# Patient Record
Sex: Male | Born: 1950 | Race: White | Hispanic: No | Marital: Married | Smoking: Current every day smoker
Health system: Southern US, Community
[De-identification: ages and names within clinical notes are randomized; demographics above are authoritative.]

## PROBLEM LIST (undated history)

## (undated) DIAGNOSIS — G4733 Obstructive sleep apnea (adult) (pediatric): Secondary | ICD-10-CM

## (undated) DIAGNOSIS — C61 Malignant neoplasm of prostate: Secondary | ICD-10-CM

## (undated) DIAGNOSIS — E785 Hyperlipidemia, unspecified: Secondary | ICD-10-CM

## (undated) DIAGNOSIS — K219 Gastro-esophageal reflux disease without esophagitis: Secondary | ICD-10-CM

## (undated) DIAGNOSIS — Z87438 Personal history of other diseases of male genital organs: Secondary | ICD-10-CM

## (undated) DIAGNOSIS — I82409 Acute embolism and thrombosis of unspecified deep veins of unspecified lower extremity: Secondary | ICD-10-CM

## (undated) DIAGNOSIS — E291 Testicular hypofunction: Secondary | ICD-10-CM

## (undated) DIAGNOSIS — K2270 Barrett's esophagus without dysplasia: Secondary | ICD-10-CM

## (undated) DIAGNOSIS — J45909 Unspecified asthma, uncomplicated: Secondary | ICD-10-CM

## (undated) DIAGNOSIS — I011 Acute rheumatic endocarditis: Secondary | ICD-10-CM

## (undated) DIAGNOSIS — L0390 Cellulitis, unspecified: Secondary | ICD-10-CM

## (undated) DIAGNOSIS — M069 Rheumatoid arthritis, unspecified: Secondary | ICD-10-CM

## (undated) DIAGNOSIS — I82499 Acute embolism and thrombosis of other specified deep vein of unspecified lower extremity: Secondary | ICD-10-CM

## (undated) DIAGNOSIS — N529 Male erectile dysfunction, unspecified: Secondary | ICD-10-CM

## (undated) HISTORY — DX: Acute embolism and thrombosis of other specified deep vein of unspecified lower extremity: I82.499

## (undated) HISTORY — DX: Male erectile dysfunction, unspecified: N52.9

## (undated) HISTORY — DX: Acute rheumatic endocarditis: I01.1

## (undated) HISTORY — PX: PROSTATE BIOPSY: SHX241

## (undated) HISTORY — DX: Cellulitis, unspecified: L03.90

## (undated) HISTORY — DX: Unspecified asthma, uncomplicated: J45.909

## (undated) HISTORY — DX: Acute embolism and thrombosis of unspecified deep veins of unspecified lower extremity: I82.409

## (undated) HISTORY — DX: Rheumatoid arthritis, unspecified: M06.9

---
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
## 2000-09-21 ENCOUNTER — Ambulatory Visit (HOSPITAL_COMMUNITY): Admission: RE | Admit: 2000-09-21 | Discharge: 2000-09-21 | Payer: Self-pay | Admitting: Orthopedic Surgery

## 2000-09-21 ENCOUNTER — Encounter: Payer: Self-pay | Admitting: Orthopedic Surgery

## 2004-09-13 ENCOUNTER — Encounter (INDEPENDENT_AMBULATORY_CARE_PROVIDER_SITE_OTHER): Payer: Self-pay | Admitting: *Deleted

## 2004-09-13 ENCOUNTER — Ambulatory Visit (HOSPITAL_COMMUNITY): Admission: RE | Admit: 2004-09-13 | Discharge: 2004-09-13 | Payer: Self-pay | Admitting: *Deleted

## 2005-10-20 ENCOUNTER — Encounter (INDEPENDENT_AMBULATORY_CARE_PROVIDER_SITE_OTHER): Payer: Self-pay | Admitting: Specialist

## 2005-10-20 ENCOUNTER — Ambulatory Visit (HOSPITAL_COMMUNITY): Admission: RE | Admit: 2005-10-20 | Discharge: 2005-10-20 | Payer: Self-pay | Admitting: *Deleted

## 2007-11-20 ENCOUNTER — Ambulatory Visit (HOSPITAL_COMMUNITY): Admission: RE | Admit: 2007-11-20 | Discharge: 2007-11-20 | Payer: Self-pay | Admitting: *Deleted

## 2007-11-20 ENCOUNTER — Encounter (INDEPENDENT_AMBULATORY_CARE_PROVIDER_SITE_OTHER): Payer: Self-pay | Admitting: *Deleted

## 2010-09-21 NOTE — Op Note (Signed)
NAMETROYE, HIEMSTRA NO.:  [id number]   MEDICAL RECORD NO.:  [id number]          PATIENT TYPE:  AMB   LOCATION:  ENDO                         FACILITY:  [facility]   PHYSICIAN:  Georgiana Spinner, M.D.    DATE OF BIRTH:  [birth year]   DATE OF PROCEDURE:  DATE OF DISCHARGE:                               OPERATIVE REPORT   PROCEDURE:  Upper endoscopy with biopsy.   INDICATIONS:  Barrett's esophagus.   ANESTHESIA:  Fentanyl 75 mcg, Versed 7 mg.   PROCEDURE:  With the patient mildly sedated in the left lateral  decubitus position the Pentax videoscopic endoscope was inserted into  the mouth and passed under direct vision through the esophagus, which  appeared normal until we reached the distal esophagus and there was  question of 1 area of Barrett's esophagus;  photographed and biopsied  this area.  We then entered into the stomach fundus, body, antrum,  duodenal bulb, second portion of the duodenum all appeared normal.  From  this point the endoscope was slowly withdrawn, taking circumferential  views of the duodenal mucosa until the endoscope had been pulled back  into stomach and placed in retroflexion to view the stomach from below.  The endoscope was straightened and withdrawn, taking circumferential  views from the remaining gastric and esophageal mucosa.  The patient's  vital signs and pulse oximeter remained stable.  The patient tolerated  procedure well without any apparent complications.   FINDINGS:  Question of Barrett's esophagus once again biopsied.  Await  biopsy report.  The patient will call me for the results and follow up  with me as an outpatient.           ______________________________  Georgiana Spinner, M.D.     GMO/MEDQ  D:  11/20/2007  T:  11/20/2007  Job:  [id number]

## 2010-09-24 NOTE — Op Note (Signed)
NAMEJEFFRE, ENRIQUES NO.:  [id number]   MEDICAL RECORD NO.:  [id number]          PATIENT TYPE:  AMB   LOCATION:  ENDO                         FACILITY:  [facility]   PHYSICIAN:  Georgiana Spinner, M.D.    DATE OF BIRTH:  1950-09-24   DATE OF PROCEDURE:  09/13/2004  DATE OF DISCHARGE:                                 OPERATIVE REPORT   PROCEDURE:  Colonoscopy.   INDICATIONS:  Colon cancer screening.   ANESTHESIA:  1.  Demerol 20.  2.  Versed 2 mg.   PROCEDURE:  With patient mildly sedated in the left lateral decubitus  position. the Olympus videoscopic colonoscope was inserted in the rectum  after normal rectal exam and passed under direct vision to the cecum,  identified by ileocecal valve and appendiceal orifice, both of which were  photographed.  From this point, the colonoscope was slowly withdrawn taking  circumferential views of colonic mucosa, stopping in the rectum which  appeared normal on direct and retroflexed view.  The endoscope was  straightened and withdrawn.  The patient's vital signs and pulse oximeter  remained stable.  The patient tolerated the procedure well without apparent  complications.   FINDINGS:  Unremarkable colonoscopic examination to the cecum.   PLAN:  See endoscopy note for further details.      GMO/MEDQ  D:  09/13/2004  T:  09/13/2004  Job:  [id number]

## 2010-09-24 NOTE — Op Note (Signed)
Caleb Barajas, MAHLER NO.:  [id number]   MEDICAL RECORD NO.:  [id number]          PATIENT TYPE:  AMB   LOCATION:  ENDO                         FACILITY:  [facility]   PHYSICIAN:  Georgiana Spinner, M.D.    DATE OF BIRTH:  Jan 14, 1951   DATE OF PROCEDURE:  10/20/2005  DATE OF DISCHARGE:                                 OPERATIVE REPORT   PROCEDURE:  Upper endoscopy with biopsy.   INDICATIONS FOR PROCEDURE:  Barrett's esophagus by biopsy in the past.   ANESTHESIA:  Demerol 80 mg, Versed 8 mg.   DESCRIPTION OF PROCEDURE:  With the patient mildly sedated in the left  lateral decubitus position, the Olympus videoscopic endoscope was inserted  in the mouth, passed under direct vision through the esophagus until we  reached the distal esophagus which was photographed and biopsies of the  perimeter of the squamocolumnar junction were taken. The endoscope was  advanced into the stomach, the fundus, body, antrum, duodenal bulb and  second portion of the duodenum were visualized. From this point, the  endoscope was slowly withdrawn taking circumferential views of the duodenal  mucosa until the endoscope had been pulled back into the stomach, placed in  retroflexion to view the stomach from below. The endoscope was straightened  and withdrawn taking circumferential views of the remaining gastric and  esophageal mucosa. The patient's vital signs and pulse oximeter remained  stable. The patient tolerated the procedure well without apparent  complications.   FINAL DIAGNOSES:  Barrett's esophagus, biopsied. Await biopsy report. The  patient will call me for results and followup with me as an outpatient.           ______________________________  Georgiana Spinner, M.D.     GMO/MEDQ  D:  10/20/2005  T:  10/20/2005  Job:  [id number]

## 2010-09-24 NOTE — Op Note (Signed)
NAMEXZAYVIER, FAGIN NO.:  [id number]   MEDICAL RECORD NO.:  [id number]          PATIENT TYPE:  AMB   LOCATION:  ENDO                         FACILITY:  [facility]   PHYSICIAN:  Georgiana Spinner, M.D.    DATE OF BIRTH:  1950-10-21   DATE OF PROCEDURE:  09/13/2004  DATE OF DISCHARGE:                                 OPERATIVE REPORT   PROCEDURE:  Upper endoscopy.   INDICATIONS:  Gastroesophageal reflux disease.   ANESTHESIA:  Demerol 70, Versed 7 mg.   DESCRIPTION OF PROCEDURE:  With the patient mildly sedated in the left  lateral decubitus position, the Olympus videoscopic endoscope was inserted  in the mouth and passed under direct vision through the esophagus which  appeared normal until we reached the distal esophagus and there were changes  of esophagitis, photographed and biopsied. We entered into the stomach,  fundus, body, antrum, duodenal bulb, second portion of duodenum appeared  normal. From this point, the endoscope was slowly withdrawn taking  circumferential views of duodenal mucosa until the endoscope had been pulled  back into the stomach, placed in retroflexion to view the stomach from  below. The endoscope was straightened and withdrawn taking circumferential  views of the remaining gastric and esophageal mucosa. The patient's vital  signs and pulse oximeter remained stable. The patient tolerated procedure  well without apparent complications.   FINDINGS:  Changes of esophagitis, photographed and biopsied, await biopsy  report. The patient will call me for results and follow-up me as an  outpatient but will need to treat the patient for chronic reflux, possibly  with a PPI.      GMO/MEDQ  D:  09/13/2004  T:  09/13/2004  Job:  [id number]

## 2012-07-25 ENCOUNTER — Other Ambulatory Visit: Payer: Self-pay | Admitting: Urology

## 2012-08-03 ENCOUNTER — Encounter (HOSPITAL_COMMUNITY)
Admission: RE | Admit: 2012-08-03 | Discharge: 2012-08-03 | Disposition: A | Discharge status: Home / Self Care | Payer: Managed Care, Other (non HMO) | Source: Ambulatory Visit | Attending: Urology | Admitting: Urology

## 2012-08-03 DIAGNOSIS — C61 Malignant neoplasm of prostate: Secondary | ICD-10-CM | POA: Insufficient documentation

## 2012-08-03 DIAGNOSIS — M25579 Pain in unspecified ankle and joints of unspecified foot: Secondary | ICD-10-CM | POA: Insufficient documentation

## 2012-08-03 DIAGNOSIS — M25519 Pain in unspecified shoulder: Secondary | ICD-10-CM | POA: Insufficient documentation

## 2012-08-03 DIAGNOSIS — M25569 Pain in unspecified knee: Secondary | ICD-10-CM | POA: Insufficient documentation

## 2012-08-03 DIAGNOSIS — M25529 Pain in unspecified elbow: Secondary | ICD-10-CM | POA: Insufficient documentation

## 2012-08-03 MED ORDER — TECHNETIUM TC 99M MEDRONATE IV KIT
25.0000 | PACK | Freq: Once | INTRAVENOUS | Status: AC | PRN
  Administered 2012-08-03: 25 via INTRAVENOUS

## 2012-10-22 ENCOUNTER — Ambulatory Visit (HOSPITAL_COMMUNITY)
Admission: RE | Admit: 2012-10-22 | Discharge: 2012-10-22 | Disposition: A | Discharge status: Home / Self Care | Payer: Managed Care, Other (non HMO) | Source: Ambulatory Visit | Attending: Urology | Admitting: Urology

## 2012-10-22 ENCOUNTER — Other Ambulatory Visit: Payer: Self-pay | Admitting: Urology

## 2012-10-22 DIAGNOSIS — C61 Malignant neoplasm of prostate: Secondary | ICD-10-CM

## 2012-10-22 DIAGNOSIS — Z87891 Personal history of nicotine dependence: Secondary | ICD-10-CM | POA: Insufficient documentation

## 2012-11-13 ENCOUNTER — Encounter: Payer: Self-pay | Admitting: Radiation Oncology

## 2012-11-13 DIAGNOSIS — C61 Malignant neoplasm of prostate: Secondary | ICD-10-CM | POA: Insufficient documentation

## 2012-11-13 NOTE — Progress Notes (Addendum)
Radiation Oncology         [phone]) [phone] ________________________________  Initial outpatient Consultation  Name: Caleb Barajas MRN: [id number]  Date: 11/14/2012  DOB: 1950/05/13  NF:AOZHY,QMVHQI DAVIDSON, MD  [name], MD   REFERRING PHYSICIAN: [name], MD  DIAGNOSIS: 62 y.o. 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 gentleman.  He was noted to have an elevated PSA of 6.4 by his primary care physician, Dr. Renne Crigler in August 2013.  This represented a rise from 3.2 the prior year. Accordingly, he was referred for evaluation in urology by Dr. Annabell Howells on 02/13/2012,  digital rectal examination was performed at that time revealing no nodularity.  The patient proceeded to transrectal ultrasound with 12 biopsies of the prostate on 04/09/12.  The prostate volume measured 43.2 cc.  Out of 12 core biopsies, 2 were positive.  The maximum Gleason score was 3+3, and this was seen in 20% in the left medial apex and less than 5% in the left mid lateral prostate.  The patient reviewed the biopsy results with his urologist and elected to pursue active surveillance. His PSA jumped to 9.25 on 07/16/2012. Bone scan on 08/03/2012 showed no evidence of metastases. Prostate MRI was performed on 08/06/2012 demonstrating a couple areas of abnormal signal worrisome for locally advanced prostate carcinoma with extracapsular extension including an area in the left apex where there is concern for neurovascular bundle involvement and the second site in the anterior aspect of the right peripheral zone. His PSA decreased to 5.33 on 08/14/2012 and 5.53 on 09/06/2012. PSA on 10/15/2012 was 5.66.  Transrectal ultrasound with prostate biopsies was repeated on 10/23/2012. 12 core biopsies were obtained with additional cores taken from areas of concern seen on the MRI scan. 7/16 core biopsies were positive for  adenocarcinoma with Gleason score of 3+3. This was seen in 40% of the left mid, less than 5% of the left lateral mid, 20% of the left apex, 5% of the additional left-sided specimens, 25% of the right apex, 20% of the right apex, and 20% of the right additional core specimens.  The patient discussed the new biopsy results with his urologist and he has kindly been referred today for discussion of potential radiation treatment options.  PREVIOUS RADIATION THERAPY: No  PAST MEDICAL HISTORY:  has a past medical history of BPH (benign prostatic hyperplasia); Other testicular hypofunction; Impotence of organic origin; Malignant neoplasm of prostate; Unspecified sleep apnea; Unspecified asthma(493.90); Esophageal reflux; Pure hypercholesterolemia; and Rheumatoid arthritis(714.0).    PAST SURGICAL HISTORY: Past Surgical History  Procedure Laterality Date  . Prostate biopsy    . Left inguinal hernia repair      FAMILY HISTORY: family history includes Cancer in his father and mother and Emphysema in his father.  SOCIAL HISTORY:  reports that he quit smoking about 11 years ago. His smoking use included Cigarettes. He has a 20 pack-year smoking history. He has never used smokeless tobacco. He reports that  drinks alcohol. He reports that he does not use illicit drugs.  ALLERGIES: Review of patient's allergies indicates no known allergies.  MEDICATIONS:  Current Outpatient Prescriptions  Medication Sig Dispense Refill  . fish oil-omega-3 fatty acids 1000 MG capsule Take 2 g by mouth daily.      . folic acid (FOLVITE) 1 MG tablet Take 1 mg by mouth daily.      Marland Kitchen  glucosamine-chondroitin 500-400 MG tablet Take 1 tablet by mouth 3 (three) times daily.      . methotrexate (RHEUMATREX) 2.5 MG tablet Take 2.5 mg by mouth once a week. Caution:Chemotherapy. Protect from light.      Marland Kitchen omeprazole (PRILOSEC) 20 MG capsule Take 20 mg by mouth daily.      Marland Kitchen pyridOXINE (VITAMIN B-6) 100 MG tablet Take 100 mg by  mouth daily.      . saw palmetto 160 MG capsule Take 160 mg by mouth 2 (two) times daily.      . Testosterone (ANDROGEL) 20.25 MG/1.25GM (1.62%) GEL Place onto the skin.       No current facility-administered medications for this encounter.    REVIEW OF SYSTEMS:  A 15 point review of systems is documented in the electronic medical record. This was obtained by the nursing staff. However, I reviewed this with the patient to discuss relevant findings and make appropriate changes.  A comprehensive review of systems was negative..  The patient completed an IPSS and IIEF questionnaire.  His IPSS score was 9 indicating mild urinary outflow obstructive symptoms.     PHYSICAL EXAM: This patient is in no acute distress.  He is alert and oriented.   height is 5\' 11"  (1.803 m) and weight is 188 lb 6.4 oz (85.458 kg). His oral temperature is 98.9 F (37.2 C). His blood pressure is 120/74 and his pulse is 64. His respiration is 16 and oxygen saturation is 100%.  He exhibits no respiratory distress or labored breathing.  He appears neurologically intact.  His mood is pleasant.  His affect is appropriate.  Please note the digital rectal exam findings described above.  LABORATORY DATA:  No results found for this basename: WBC,  HGB,  HCT,  MCV,  PLT   No results found for this basename: NA,  K,  CL,  CO2   No results found for this basename: ALT,  AST,  GGT,  ALKPHOS,  BILITOT     RADIOGRAPHY: Dg Chest 2 View  10/22/2012   *RADIOLOGY REPORT*  Clinical Data: 62 year old male with history of prostate cancer. Former smoker.  CHEST - 2 VIEW  Comparison: Whole body bone scan 08/03/2012.  Findings: Lung volumes are within normal limits.  Cardiac size and mediastinal contours are within normal limits.  Visualized tracheal air column is within normal limits.  The lungs are clear.  No pneumothorax or pleural effusion.  No pulmonary nodule or mass identified.  Visualized osseous structures appear within normal limits.   IMPRESSION: No acute or metastatic cardiopulmonary abnormality.   Original Report Authenticated By: Erskine Speed, M.D.      IMPRESSION: This gentleman is a 62 y.o. 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  His T-Stage, Gleason's Score, and PSA put him into the favorable risk group.  Accordingly he is eligible for a variety of potential treatment options including continued active surveillance, robotic-assisted laparoscopic radical prostatectomy, external beam radiotherapy, and prostate brachytherapy.  PLAN:Today I reviewed the findings and workup thus far.  We discussed the natural history of prostate cancer.  We reviewed the the implications of T-stage, Gleason's Score, and PSA on decision-making and outcomes in prostate cancer. We talked about his MRI results showing the possibility for extracapsular extension. We discussed radiation treatment in the management of prostate cancer with regard to the logistics and delivery of external beam radiation treatment as well as the  logistics and delivery of prostate brachytherapy.  We compared and contrasted each of these approaches and also compared these against prostatectomy.  The patient expressed interest in prostate brachytherapy.  I filled out a patient counseling form for him with relevant treatment diagrams and we retained a copy for our records.   The patient would like to proceed with prostate brachytherapy.  I will share my findings with Dr. Annabell Howells and move forward with scheduling the procedure in the near future.  I look forward to reviewing the patient's outside MRI study to help plan and designed the radiation seed distribution in order to adequately cover any portions of the prostate cancer where extracapsular extension are suspected.   I enjoyed meeting with him today, and will look forward to participating in the care of this very nice gentleman.     I spent 60 minutes face to face  with the patient and more than 50% of that time was spent in counseling and/or coordination of care.   ------------------------------------------------  Artist Pais. Kathrynn Running, M.D.

## 2012-11-13 NOTE — Progress Notes (Signed)
GU Location of Tumor / Histology: Adenocarcinoma of the prostate  If Prostate Cancer, Gleason Score is (3 + 3=6) and PSA is (5)  Patient presented 12/2011 with signs/symptoms of: elevated psa and issues with erections  Biopsies of prostate (if applicable) revealed: Adenocarcinoma T1c N0 M0; 3 right mid to apical cores and 4 left mid to apical cores with Gleason 6 involvement ranging from 5-40%.  Past/Anticipated interventions by urology, if any: Ordered chest xray for follow up of sternomanubrial abnormalities noted on bone scan.  Past/Anticipated interventions by medical oncology, if any: None  Weight changes, if any: recent 5 pound weight loss  Bowel/Bladder complaints, if any: voids on average once during the night; occasional difficulty maintaining an erection   Nausea/Vomiting, if any: None noted  Pain issues, if any:  Joint pain (polyarthralgias) but, no bone pain   SAFETY ISSUES:  Prior radiation? NO  Pacemaker/ICD? NO  Possible current pregnancy? NO  Is the patient on methotrexate? Yes  Current Complaints / other details:  62 year old male. Married. Retired Programmer, [employer].  Prostate volume 31 cc. Most interested in brachytherapy because he believes he will be at lower risk for voiding dysfunction.

## 2012-11-14 ENCOUNTER — Ambulatory Visit
Admission: RE | Admit: 2012-11-14 | Discharge: 2012-11-14 | Disposition: A | Discharge status: Home / Self Care | Payer: Managed Care, Other (non HMO) | Source: Ambulatory Visit | Attending: Radiation Oncology | Admitting: Radiation Oncology

## 2012-11-14 ENCOUNTER — Encounter: Payer: Self-pay | Admitting: Radiation Oncology

## 2012-11-14 VITALS — BP 120/74 | HR 64 | Temp 98.9°F | Resp 16 | Ht 71.0 in | Wt 188.4 lb

## 2012-11-14 DIAGNOSIS — M069 Rheumatoid arthritis, unspecified: Secondary | ICD-10-CM | POA: Insufficient documentation

## 2012-11-14 DIAGNOSIS — Z79899 Other long term (current) drug therapy: Secondary | ICD-10-CM | POA: Insufficient documentation

## 2012-11-14 DIAGNOSIS — C61 Malignant neoplasm of prostate: Secondary | ICD-10-CM

## 2012-11-14 NOTE — Progress Notes (Signed)
See progress note under physician encounter. 

## 2012-11-14 NOTE — Progress Notes (Signed)
Reports taking methotrexate for rheumatoid arthritis that is now being managed. Now that his arthritis is under control he has returned to work full time. Reports that his only pain is joint pain related to arthritis. Reports that on average he gets up twice during the night to void. Denies burning with urination or hematuria. Denies incontinence. Reports normal formed daily bowel movements. Reports difficulty maintaining an erection. Reports normal strong urine stream. Reports that he is asymptomatic and "wouldn't know anything was wrong if his psa wasn't rising." IPSS 9. Complete PATIENT MEASURE OF DISTRESS worksheet with a score of 0 submitted to social work.

## 2012-11-15 ENCOUNTER — Other Ambulatory Visit: Payer: Self-pay | Admitting: Urology

## 2012-11-16 ENCOUNTER — Telehealth: Payer: Self-pay | Admitting: *Deleted

## 2012-11-16 NOTE — Telephone Encounter (Signed)
CALLED PATIENT TO INFORM OF PRE-SEED APPT. AND IMPLANT, SPOKE WITH PATEINT AND HE IS AWARE OF THESE APPTS.

## 2012-11-21 ENCOUNTER — Telehealth: Payer: Self-pay | Admitting: *Deleted

## 2012-11-21 NOTE — Telephone Encounter (Signed)
CALLED PATIENT TO REMIND OF APPT. FOR 11-22-12 AT 3:45 PM, SPOKE WITH PATIENT AND HE IS AWARE OF THIS APPT.

## 2012-11-21 NOTE — Telephone Encounter (Signed)
XXXX 

## 2012-11-22 ENCOUNTER — Encounter (HOSPITAL_BASED_OUTPATIENT_CLINIC_OR_DEPARTMENT_OTHER)
Admission: RE | Admit: 2012-11-22 | Discharge: 2012-11-22 | Disposition: A | Discharge status: Home / Self Care | Payer: Managed Care, Other (non HMO) | Source: Ambulatory Visit | Attending: Urology | Admitting: Urology

## 2012-11-22 ENCOUNTER — Other Ambulatory Visit: Payer: Self-pay

## 2012-11-22 ENCOUNTER — Encounter: Payer: Self-pay | Admitting: Radiation Oncology

## 2012-11-22 ENCOUNTER — Ambulatory Visit
Admission: RE | Admit: 2012-11-22 | Discharge: 2012-11-22 | Disposition: A | Discharge status: Home / Self Care | Payer: Managed Care, Other (non HMO) | Source: Ambulatory Visit | Attending: Radiation Oncology | Admitting: Radiation Oncology

## 2012-11-22 DIAGNOSIS — C61 Malignant neoplasm of prostate: Secondary | ICD-10-CM | POA: Insufficient documentation

## 2012-11-22 DIAGNOSIS — Z01810 Encounter for preprocedural cardiovascular examination: Secondary | ICD-10-CM | POA: Insufficient documentation

## 2012-11-22 NOTE — Assessment & Plan Note (Signed)
Plan seed implant

## 2012-11-22 NOTE — Progress Notes (Signed)
  Radiation Oncology         [phone] ________________________________  Name: [name] MRN: [id number]  Date: 11/22/2012  DOB: [birth year]  SIMULATION AND TREATMENT PLANNING NOTE PUBIC ARCH STUDY  LK:[id number],UVOZDG Ignacia Palma, MD  [name], MD  DIAGNOSIS: Stage T1c adenocarcinoma of the prostate with Gleason score of 3+3 and a PSA of 5.53 - favorable risk Plan seed implant   COMPLEX SIMULATION:  The patient presented today for evaluation for possible prostate seed implant. He was brought to the radiation planning suite and placed supine on the CT couch. A 3-dimensional image study set was obtained in upload to the planning computer. There, on each axial slice, I contoured the prostate gland. Then, using three-dimensional radiation planning tools I reconstructed the prostate in view of the structures from the transperineal needle pathway to assess for possible pubic arch interference. In doing so, I did not appreciate any pubic arch interference. Also, the patient's prostate volume was estimated based on the drawn structure. The volume was 34 cc.  Given the pubic arch appearance and prostate volume, patient remains a good candidate to proceed with prostate seed implant. Today, he freely provided informed written consent to proceed.    PLAN: The patient will undergo prostate seed implant.   ________________________________  Artist Pais. Kathrynn Running, M.D.

## 2012-12-25 ENCOUNTER — Encounter (HOSPITAL_BASED_OUTPATIENT_CLINIC_OR_DEPARTMENT_OTHER): Payer: Self-pay | Admitting: *Deleted

## 2012-12-26 ENCOUNTER — Encounter (HOSPITAL_BASED_OUTPATIENT_CLINIC_OR_DEPARTMENT_OTHER): Payer: Self-pay | Admitting: *Deleted

## 2012-12-26 NOTE — Progress Notes (Signed)
To [facility] at 0815-Ekg ,Cxr with chart.Lab work to be drawn 12/27/12-Npo after Mn-instructed to take prilosec with small amt water that am-to bring methotrexate,also to complete fleet enema prior to arrival.

## 2012-12-27 ENCOUNTER — Telehealth: Payer: Self-pay | Admitting: *Deleted

## 2012-12-27 LAB — PROTIME-INR: Prothrombin Time: 12.2 seconds (ref 11.6–15.2)

## 2012-12-27 LAB — COMPREHENSIVE METABOLIC PANEL
ALT: 9 U/L (ref 0–53)
Albumin: 3.8 g/dL (ref 3.5–5.2)
Alkaline Phosphatase: 56 U/L (ref 39–117)
BUN: 17 mg/dL (ref 6–23)
Chloride: 99 mEq/L (ref 96–112)
Potassium: 4.4 mEq/L (ref 3.5–5.1)
Sodium: 136 mEq/L (ref 135–145)
Total Bilirubin: 0.5 mg/dL (ref 0.3–1.2)
Total Protein: 6.7 g/dL (ref 6.0–8.3)

## 2012-12-27 LAB — CBC
HCT: 36.3 % — ABNORMAL LOW (ref 39.0–52.0)
Hemoglobin: 12.2 g/dL — ABNORMAL LOW (ref 13.0–17.0)
MCHC: 33.6 g/dL (ref 30.0–36.0)
RDW: 14.7 % (ref 11.5–15.5)
WBC: 10.4 10*3/uL (ref 4.0–10.5)

## 2012-12-27 LAB — APTT: aPTT: 33 seconds (ref 24–37)

## 2012-12-27 NOTE — Telephone Encounter (Signed)
CALLED PATIENT TO REMIND OF APPT. FOR 12-27-12, SPOKE WITH PATIENT AND HE IS AWARE OF THIS APPT.

## 2012-12-31 ENCOUNTER — Telehealth: Payer: Self-pay | Admitting: *Deleted

## 2012-12-31 NOTE — Telephone Encounter (Signed)
CALLED PATIENT TO REMIND OF PROCEDURE FOR 01-03-13, SPOKE WITH PATIENT AND HE IS AWARE OF THIS PROCEDURE

## 2013-01-02 NOTE — H&P (Signed)
eason For Visit  Seen today for a pre-op H&P.   Active Problems Problems  1. Prostate Cancer 185 2. Visit For: Preoperative Urological Exam V72.83  History of Present Illness        62 YO male patient of Dr. Belva Crome seen today for a pre-op H&P. Scheduled for I-125 seed implant 01/03/13.  GU HX:  10/17/12 repeat biopsy with additional cores in the anterior prostate based on the MRI report for his T1c N0 M0 prostate cancer.   His PSA jumped to over 9 before returning to about 5 where it remains stable at this time.    He was found to have 3 right mid to apical cores and 4 left mid to apical cores with Gleason 6 involvement ranging from <5 to 40%.  Previously he had only 2 left cores.   Interval HX:   Today denies any acute issues. No cough, SOB, or chest pain.   Past Medical History Problems  1. History of  Adult Sleep Apnea 780.57 2. History of  Asthma 493.90 3. History of  Esophageal Reflux 530.81 4. History of  Hypercholesterolemia 272.0 5. History of  Rheumatoid Arthritis 714.0  Surgical History Problems  1. History of  Inguinal Hernia Repair Left  Current Meds 1. AndroGel Pump 20.25 MG/ACT (1.62%) Transdermal Gel; Therapy: 12Mar2014 to 2. Fish Oil CAPS; Therapy: (Recorded:07Oct2013) to 3. Folic Acid CAPS; Therapy: (Recorded:16Jun2014) to 4. Glucosamine-Chondroitin 750-600 MG Oral Tablet; Therapy: (Recorded:08May2014) to 5. Levofloxacin 500 MG Oral Tablet; Take 1 tablet po the day before procedure, 1 tab  day of the  procedure and 1 tab the day after procedure; Therapy: 08May2014 to (Last Rx:08May2014) 6. Methotrexate 2.5 MG Oral Tablet; Therapy: (Recorded:16Jun2014) to 7. Omeprazole 20 MG Oral Capsule Delayed Release; Therapy: (Recorded:07Oct2013) to 8. PredniSONE TABS; Therapy: (Recorded:16Jun2014) to 9. Saw Palmetto CAPS; Therapy: (Recorded:07Oct2013) to 10. Vitamin B Complex TABS; Therapy: (Recorded:07Oct2013) to  Allergies Medication  1. No Known Drug  Allergies  Family History Problems  1. Family history of  Death In The Family Father age 20 of emphysema 2. Family history of  Death In The Family Mother age 55 of cancer 3. Family history of  Family Health Status Number Of Children 2 daughters 4. Paternal history of  Prostate Cancer V16.42  Social History Problems  1. Alcohol Use 2-3 a day 2. History of  Former Smoker V15.82 smoked 1 ppd x 20 years but quit about 10 years ago. 3. Marital History - Currently Married 4. Occupation: Estate agent  Review of Systems Genitourinary, constitutional, skin, eye, otolaryngeal, hematologic/lymphatic, cardiovascular, pulmonary, endocrine, musculoskeletal, gastrointestinal, neurological and psychiatric system(s) were reviewed and pertinent findings if present are noted.    Vitals Vital Signs [Data Includes: Last 1 Day]  21Aug2014 08:21AM  Blood Pressure: 117 / 77 Temperature: 97.9 F Heart Rate: 60  Physical Exam Constitutional: Well nourished and well developed . No acute distress. The patient appears well hydrated.  ENT:. The ears and nose are normal in appearance.  Neck: The appearance of the neck is normal.  Pulmonary: No respiratory distress.  Cardiovascular: Heart rate and rhythm are normal.  Abdomen: The abdomen is flat. The abdomen is soft and nontender. No suprapubic tenderness. No CVA tenderness.  Rectal: The prostate exam was deferred.  Skin: Normal skin turgor and normal skin color and pigmentation.  Neuro/Psych:. Mood and affect are appropriate.    Results/Data Urine [Data Includes: Last 1 Day]   21Aug2014  COLOR YELLOW   APPEARANCE CLEAR   SPECIFIC GRAVITY  1.015   pH 5.5   GLUCOSE NEG mg/dL  BILIRUBIN NEG   KETONE NEG mg/dL  BLOOD NEG   PROTEIN NEG mg/dL  UROBILINOGEN 0.2 mg/dL  NITRITE NEG   LEUKOCYTE ESTERASE NEG    The following clinical lab reports were reviewed:  UA-negative.    Assessment Assessed  1. Prostate Cancer 185 2. Visit  For: Preoperative Urological Exam V72.83  Plan Prostate Cancer (185)  1. Follow-up Keep Future Appt Office  Follow-up  Requested for: 21Aug2014   Proceed with surgical procedure with Dr. Annabell Howells 01/03/13. Will f/u po as scheduled 01/24/13.   Discussion/Summary   CC: Dr. Merri Brunette.

## 2013-01-03 ENCOUNTER — Encounter (HOSPITAL_BASED_OUTPATIENT_CLINIC_OR_DEPARTMENT_OTHER)
Admission: RE | Disposition: A | Discharge status: Home / Self Care | Payer: Self-pay | Source: Ambulatory Visit | Attending: Urology

## 2013-01-03 ENCOUNTER — Ambulatory Visit (HOSPITAL_COMMUNITY): Payer: Managed Care, Other (non HMO)

## 2013-01-03 ENCOUNTER — Encounter (HOSPITAL_BASED_OUTPATIENT_CLINIC_OR_DEPARTMENT_OTHER): Payer: Self-pay | Admitting: Anesthesiology

## 2013-01-03 ENCOUNTER — Encounter (HOSPITAL_BASED_OUTPATIENT_CLINIC_OR_DEPARTMENT_OTHER): Payer: Self-pay

## 2013-01-03 ENCOUNTER — Ambulatory Visit (HOSPITAL_BASED_OUTPATIENT_CLINIC_OR_DEPARTMENT_OTHER)
Admission: RE | Admit: 2013-01-03 | Discharge: 2013-01-03 | Disposition: A | Discharge status: Home / Self Care | Payer: Managed Care, Other (non HMO) | Source: Ambulatory Visit | Attending: Urology | Admitting: Urology

## 2013-01-03 ENCOUNTER — Ambulatory Visit (HOSPITAL_BASED_OUTPATIENT_CLINIC_OR_DEPARTMENT_OTHER): Payer: Managed Care, Other (non HMO) | Admitting: Anesthesiology

## 2013-01-03 DIAGNOSIS — M069 Rheumatoid arthritis, unspecified: Secondary | ICD-10-CM | POA: Insufficient documentation

## 2013-01-03 DIAGNOSIS — Z79899 Other long term (current) drug therapy: Secondary | ICD-10-CM | POA: Insufficient documentation

## 2013-01-03 DIAGNOSIS — E7800 Pure hypercholesterolemia, unspecified: Secondary | ICD-10-CM | POA: Insufficient documentation

## 2013-01-03 DIAGNOSIS — G4730 Sleep apnea, unspecified: Secondary | ICD-10-CM | POA: Insufficient documentation

## 2013-01-03 DIAGNOSIS — K219 Gastro-esophageal reflux disease without esophagitis: Secondary | ICD-10-CM | POA: Insufficient documentation

## 2013-01-03 DIAGNOSIS — C61 Malignant neoplasm of prostate: Secondary | ICD-10-CM | POA: Insufficient documentation

## 2013-01-03 HISTORY — DX: Malignant neoplasm of prostate: C61

## 2013-01-03 HISTORY — DX: Barrett's esophagus without dysplasia: K22.70

## 2013-01-03 HISTORY — DX: Obstructive sleep apnea (adult) (pediatric): G47.33

## 2013-01-03 HISTORY — DX: Personal history of other diseases of male genital organs: Z87.438

## 2013-01-03 HISTORY — DX: Testicular hypofunction: E29.1

## 2013-01-03 HISTORY — DX: Hyperlipidemia, unspecified: E78.5

## 2013-01-03 HISTORY — DX: Gastro-esophageal reflux disease without esophagitis: K21.9

## 2013-01-03 HISTORY — PX: RADIOACTIVE SEED IMPLANT: SHX5150

## 2013-01-03 SURGERY — INSERTION, RADIATION SOURCE, PROSTATE
Anesthesia: General | Site: Prostate | Wound class: Clean

## 2013-01-03 MED ORDER — SODIUM CHLORIDE 0.9 % IV SOLN
250.0000 mL | INTRAVENOUS | Status: DC | PRN
  Filled 2013-01-03: qty 250

## 2013-01-03 MED ORDER — KETOROLAC TROMETHAMINE 30 MG/ML IJ SOLN
15.0000 mg | Freq: Once | INTRAMUSCULAR | Status: DC | PRN
  Filled 2013-01-03: qty 1

## 2013-01-03 MED ORDER — ACETAMINOPHEN 650 MG RE SUPP
650.0000 mg | RECTAL | Status: DC | PRN
  Filled 2013-01-03: qty 1

## 2013-01-03 MED ORDER — FENTANYL CITRATE 0.05 MG/ML IJ SOLN
25.0000 ug | INTRAMUSCULAR | Status: DC | PRN
  Filled 2013-01-03: qty 1

## 2013-01-03 MED ORDER — DOCUSATE SODIUM 100 MG PO CAPS: 100.0000 mg | ORAL_CAPSULE | Freq: Two times a day (BID) | ORAL | Status: DC

## 2013-01-03 MED ORDER — MIDAZOLAM HCL 5 MG/5ML IJ SOLN
INTRAMUSCULAR | Status: DC | PRN
  Administered 2013-01-03: 2 mg via INTRAVENOUS

## 2013-01-03 MED ORDER — OXYCODONE HCL 5 MG PO TABS
5.0000 mg | ORAL_TABLET | ORAL | Status: DC | PRN
  Filled 2013-01-03: qty 2

## 2013-01-03 MED ORDER — ACETAMINOPHEN 325 MG PO TABS
650.0000 mg | ORAL_TABLET | ORAL | Status: DC | PRN
  Filled 2013-01-03: qty 2

## 2013-01-03 MED ORDER — LIDOCAINE HCL (CARDIAC) 20 MG/ML IV SOLN
INTRAVENOUS | Status: DC | PRN
  Administered 2013-01-03: 80 mg via INTRAVENOUS

## 2013-01-03 MED ORDER — TAMSULOSIN HCL 0.4 MG PO CAPS: 0.4000 mg | ORAL_CAPSULE | Freq: Every day | ORAL | Status: DC

## 2013-01-03 MED ORDER — PROMETHAZINE HCL 25 MG/ML IJ SOLN
6.2500 mg | INTRAMUSCULAR | Status: DC | PRN
  Filled 2013-01-03: qty 1

## 2013-01-03 MED ORDER — CIPROFLOXACIN HCL 250 MG PO TABS: 500.0000 mg | ORAL_TABLET | Freq: Two times a day (BID) | ORAL | Status: AC

## 2013-01-03 MED ORDER — CIPROFLOXACIN IN D5W 400 MG/200ML IV SOLN
400.0000 mg | INTRAVENOUS | Status: AC
  Administered 2013-01-03: 400 mg via INTRAVENOUS
  Filled 2013-01-03: qty 200

## 2013-01-03 MED ORDER — SODIUM CHLORIDE 0.9 % IJ SOLN
3.0000 mL | Freq: Two times a day (BID) | INTRAMUSCULAR | Status: DC
  Filled 2013-01-03: qty 3

## 2013-01-03 MED ORDER — HYDROMORPHONE HCL PF 1 MG/ML IJ SOLN
0.2500 mg | INTRAMUSCULAR | Status: DC | PRN
  Filled 2013-01-03: qty 1

## 2013-01-03 MED ORDER — KETOROLAC TROMETHAMINE 30 MG/ML IJ SOLN
INTRAMUSCULAR | Status: DC | PRN
  Administered 2013-01-03: 30 mg via INTRAVENOUS

## 2013-01-03 MED ORDER — LACTATED RINGERS IV SOLN
INTRAVENOUS | Status: DC
  Administered 2013-01-03: 09:00:00 via INTRAVENOUS
  Filled 2013-01-03: qty 1000

## 2013-01-03 MED ORDER — DEXAMETHASONE SODIUM PHOSPHATE 4 MG/ML IJ SOLN
INTRAMUSCULAR | Status: DC | PRN
  Administered 2013-01-03: 10 mg via INTRAVENOUS

## 2013-01-03 MED ORDER — ONDANSETRON HCL 4 MG/2ML IJ SOLN
4.0000 mg | Freq: Four times a day (QID) | INTRAMUSCULAR | Status: DC | PRN
  Filled 2013-01-03: qty 2

## 2013-01-03 MED ORDER — ONDANSETRON HCL 4 MG/2ML IJ SOLN
INTRAMUSCULAR | Status: DC | PRN
  Administered 2013-01-03: 4 mg via INTRAVENOUS

## 2013-01-03 MED ORDER — FENTANYL CITRATE 0.05 MG/ML IJ SOLN
INTRAMUSCULAR | Status: DC | PRN
  Administered 2013-01-03: 25 ug via INTRAVENOUS
  Administered 2013-01-03: 50 ug via INTRAVENOUS
  Administered 2013-01-03: 25 ug via INTRAVENOUS
  Administered 2013-01-03: 50 ug via INTRAVENOUS

## 2013-01-03 MED ORDER — FLEET ENEMA 7-19 GM/118ML RE ENEM
1.0000 | ENEMA | Freq: Once | RECTAL | Status: DC
  Filled 2013-01-03: qty 1

## 2013-01-03 MED ORDER — HYDROCODONE-ACETAMINOPHEN 5-325 MG PO TABS: 1.0000 | ORAL_TABLET | Freq: Four times a day (QID) | ORAL | Status: DC | PRN

## 2013-01-03 MED ORDER — SODIUM CHLORIDE 0.9 % IJ SOLN
3.0000 mL | INTRAMUSCULAR | Status: DC | PRN
  Filled 2013-01-03: qty 3

## 2013-01-03 MED ORDER — PROPOFOL 10 MG/ML IV BOLUS
INTRAVENOUS | Status: DC | PRN
  Administered 2013-01-03: 200 mg via INTRAVENOUS

## 2013-01-03 SURGICAL SUPPLY — 23 items
BAG URINE DRAINAGE (UROLOGICAL SUPPLIES) ×2 IMPLANT
BLADE SURG ROTATE 9660 (MISCELLANEOUS) ×2 IMPLANT
CATH FOLEY 2WAY SLVR  5CC 16FR (CATHETERS) ×1
CATH FOLEY 2WAY SLVR 5CC 16FR (CATHETERS) ×1 IMPLANT
CATH ROBINSON RED A/P 20FR (CATHETERS) ×2 IMPLANT
CLOTH BEACON ORANGE TIMEOUT ST (SAFETY) ×2 IMPLANT
COVER MAYO STAND STRL (DRAPES) ×2 IMPLANT
COVER TABLE BACK 60X90 (DRAPES) ×2 IMPLANT
DRSG TEGADERM 4X4.75 (GAUZE/BANDAGES/DRESSINGS) ×2 IMPLANT
DRSG TEGADERM 8X12 (GAUZE/BANDAGES/DRESSINGS) ×2 IMPLANT
GAUZE SPONGE 4X4 12PLY STRL LF (GAUZE/BANDAGES/DRESSINGS) ×2 IMPLANT
GLOVE BIO SURGEON STRL SZ7.5 (GLOVE) IMPLANT
GLOVE ECLIPSE 8.0 STRL XLNG CF (GLOVE) ×10 IMPLANT
GLOVE SURG SS PI 8.0 STRL IVOR (GLOVE) ×4 IMPLANT
GOWN PREVENTION PLUS LG XLONG (DISPOSABLE) ×2 IMPLANT
GOWN STRL REIN XL XLG (GOWN DISPOSABLE) ×2 IMPLANT
HOLDER FOLEY CATH W/STRAP (MISCELLANEOUS) ×2 IMPLANT
PACK CYSTOSCOPY (CUSTOM PROCEDURE TRAY) ×2 IMPLANT
Radioactive seed ×168 IMPLANT
SYRINGE 10CC LL (SYRINGE) ×2 IMPLANT
UNDERPAD 30X30 INCONTINENT (UNDERPADS AND DIAPERS) ×4 IMPLANT
WATER STERILE IRR 3000ML UROMA (IV SOLUTION) ×2 IMPLANT
WATER STERILE IRR 500ML POUR (IV SOLUTION) ×2 IMPLANT

## 2013-01-03 NOTE — Transfer of Care (Signed)
Immediate Anesthesia Transfer of Care Note  Patient: Caleb Barajas Floor  Procedure(s) Performed: Procedure(s): RADIOACTIVE SEED IMPLANT (N/A)  Patient Location: PACU  Anesthesia Type:General  Level of Consciousness: awake and oriented  Airway & Oxygen Therapy: Patient Spontanous Breathing and Patient connected to nasal cannula oxygen  Post-op Assessment: Report given to PACU RN and Post -op   Post vital signs: Reviewed and stable  Complications: No apparent anesthesia complications

## 2013-01-03 NOTE — Interval H&P Note (Signed)
History and Physical Interval Note:  01/03/2013 9:09 AM  Caleb Barajas  has presented today for surgery, with the diagnosis of PROSTATE CANCER   The various methods of treatment have been discussed with the patient and family. After consideration of risks, benefits and other options for treatment, the patient has consented to  Procedure(s) with comments: RADIOACTIVE SEED IMPLANT (N/A) - DR portable/C-arm as a surgical intervention .  The patient's history has been reviewed, patient examined, no change in status, stable for surgery.  I have reviewed the patient's chart and labs.  Questions were answered to the patient's satisfaction.     Caleb Barajas,Caleb Barajas

## 2013-01-03 NOTE — Anesthesia Postprocedure Evaluation (Signed)
Anesthesia Post Note  Patient: Caleb Barajas  Procedure(s) Performed: Procedure(s) (LRB): RADIOACTIVE SEED IMPLANT (N/A)  Anesthesia type: General  Patient location: PACU  Post pain: Pain level controlled  Post assessment: Post-op Vital signs reviewed  Last Vitals: BP 112/75  Pulse 56  Temp(Src) 36.1 C (Oral)  Resp 16  Ht 5\' 11"  (1.803 m)  Wt 179 lb (81.194 kg)  BMI 24.98 kg/m2  SpO2 98%  Post vital signs: Reviewed  Level of consciousness: sedated  Complications: No apparent anesthesia complications

## 2013-01-03 NOTE — Op Note (Signed)
PATIENT:  Caleb Barajas  PRE-OPERATIVE DIAGNOSIS:  Adenocarcinoma of the prostate  POST-OPERATIVE DIAGNOSIS:  Same  PROCEDURE:  Procedure(s): 1. I-125 radioactive seed implantation 2. Cystoscopy  SURGEON:  Surgeon(s): Bjorn Pippin MD  Radiation oncologist: Dr. Margaretmary Dys  ANESTHESIA:  General  EBL:  Minimal  DRAINS: None  INDICATION: [name] has Gleason 6 T1c low risk prostate cancer and has elected brachytherapy.   Description of procedure: After informed consent the patient was brought to the major OR, placed on the table and administered general anesthesia. He was then moved to the modified lithotomy position with his perineum perpendicular to the floor. His perineum and genitalia were then sterilely prepped. An official timeout was then performed. A 16 French Foley catheter was then placed in the bladder and filled with dilute contrast, a rectal tube was placed in the rectum and the transrectal ultrasound probe was placed in the rectum and affixed to the stand. He was then sterilely draped.  Real time ultrasonography was used along with the seed planning software spot-pro version 3.1-00. This was used to develop the seed plan including the number of needles as well as number of seeds required for complete and adequate coverage. Real-time ultrasonography was then used along with the previously developed plan and the Nucletron device to implant a total of 84 seeds using 23 needles. This proceeded without difficulty or complication.  A Foley catheter was then removed as well as the transrectal ultrasound probe and rectal probe. Flexible cystoscopy was then performed using the 17 French flexible scope which revealed a normal urethra throughout its length down to the sphincter which appeared intact. The prostatic urethra revealed bilobar hypertrophy but no evidence of obstruction, seeds, spacers or lesions. The bladder was then entered and fully and systematically inspected. The  ureteral orifices were noted to be of normal configuration and position. The mucosa revealed no evidence of tumors. There were also no stones identified within the bladder.  There was mild trabeculation.   I noted no seeds or spacers on the floor of the bladder and retroflexion of the scope revealed no seeds protruding from the base of the prostate.  The bladder was drained and the cystoscope was then removed.   The foley was not replaced and the patient was awakened and taken to recovery room in stable and satisfactory condition. He tolerated procedure well and there were no intraoperative complications.

## 2013-01-03 NOTE — Anesthesia Preprocedure Evaluation (Addendum)
Anesthesia Evaluation  Patient identified by MRN, date of birth, ID band Patient awake    Reviewed: Allergy & Precautions, H&P , NPO status , Patient's Chart, lab work & pertinent test results  Airway Mallampati: II TM Distance: >3 FB Neck ROM: Limited    Dental no notable dental hx.    Pulmonary neg pulmonary ROS,  breath sounds clear to auscultation  Pulmonary exam normal       Cardiovascular negative cardio ROS  Rhythm:Regular Rate:Normal     Neuro/Psych negative neurological ROS  negative psych ROS   GI/Hepatic Neg liver ROS, GERD-  Medicated,  Endo/Other  negative endocrine ROS  Renal/GU negative Renal ROS  negative genitourinary   Musculoskeletal  (+) Arthritis -, Rheumatoid disorders,    Abdominal   Peds negative pediatric ROS (+)  Hematology negative hematology ROS (+)   Anesthesia Other Findings   Reproductive/Obstetrics negative OB ROS                          Anesthesia Physical Anesthesia Plan  ASA: II  Anesthesia Plan: General   Post-op Pain Management:    Induction: Intravenous  Airway Management Planned: LMA  Additional Equipment:   Intra-op Plan:   Post-operative Plan:   Informed Consent: I have reviewed the patients History and Physical, chart, labs and discussed the procedure including the risks, benefits and alternatives for the proposed anesthesia with the patient or authorized representative who has indicated his/her understanding and acceptance.   Dental advisory given  Plan Discussed with: CRNA and Surgeon  Anesthesia Plan Comments:         Anesthesia Quick Evaluation

## 2013-01-03 NOTE — Anesthesia Procedure Notes (Signed)
Procedure Name: LMA Insertion Date/Time: 01/03/2013 9:40 AM Performed by: Maris Berger T Pre-anesthesia Checklist: Patient identified, Emergency Drugs available, Suction available and Patient being monitored Patient Re-evaluated:Patient Re-evaluated prior to inductionOxygen Delivery Method: Circle System Utilized Preoxygenation: Pre-oxygenation with 100% oxygen Intubation Type: IV induction Ventilation: Mask ventilation without difficulty LMA: LMA inserted LMA Size: 5.0 Number of attempts: 1 Placement Confirmation: positive ETCO2 Dental Injury: Teeth and Oropharynx as per pre-operative assessment  Comments: Gauze roll between teeth

## 2013-01-04 ENCOUNTER — Encounter (HOSPITAL_BASED_OUTPATIENT_CLINIC_OR_DEPARTMENT_OTHER): Payer: Self-pay | Admitting: Urology

## 2013-01-07 NOTE — Procedures (Signed)
  Radiation Oncology         [phone] ________________________________  Name: Caleb Barajas MRN: [id number]  Date: 11/15/2012  DOB: [birth year]       Prostate Seed Implant  ZH:YQMVH,QIONGE DAVIDSON, MD  No ref. provider found  DIAGNOSIS: 62 y.o. gentleman with stage T1c adenocarcinoma of the prostate with a Gleason's score of 3 + 3 and a PSA of 5.66  PROCEDURE: Insertion of radioactive I-125 seeds into the prostate gland.  RADIATION DOSE: 145 Gy, definitive therapy.  TECHNIQUE: [name] was brought to the operating room with the urologist. He was placed in the dorsolithotomy position. He was catheterized and a rectal tube was inserted. The perineum was shaved, prepped and draped. The ultrasound probe was then introduced into the rectum to see the prostate gland.  TREATMENT DEVICE: A needle grid was attached to the ultrasound probe stand and anchor needles were placed.  3D PLANNING: The prostate was imaged in 3D using a sagittal sweep of the prostate probe. These images were transferred to the planning computer. There, the prostate, urethra and rectum were defined on each axial reconstructed image. Then, the software created an optimized plan and a few seed positions were adjusted. The quality of the plan was evaluated in terms of dose volume histograms and isodose reconstructions.  Then the accepted plan was uploaded to the seed Selectron afterloading unit.  SPECIAL TREATMENT PROCEDURE/SUPERVISION AND HANDLING: The Nucletron FIRST system was used to place the needles under sagittal guidance. A total of 23 needles were used to deposit 84 seeds in the prostate gland. The individual seed activity was 0.394 mCi for a total implant activity of 33.096 mCi.  COMPLEX SIMULATION: At the end of the procedure, an anterior radiograph of the pelvis was obtained to document seed positioning and count. Cystoscopy was performed to check the urethra and bladder.  MICRODOSIMETRY: At the end of the  procedure, the patient was emitting 0.14 mrem/hr at 1 meter. Accordingly, he was considered safe for hospital discharge.  PLAN: The patient will return to the radiation oncology clinic for post implant CT dosimetry in three weeks.   ________________________________  Artist Pais Kathrynn Running, M.D.

## 2013-01-24 ENCOUNTER — Encounter: Payer: Self-pay | Admitting: Radiation Oncology

## 2013-01-24 ENCOUNTER — Ambulatory Visit
Admission: RE | Admit: 2013-01-24 | Discharge: 2013-01-24 | Disposition: A | Discharge status: Home / Self Care | Payer: Managed Care, Other (non HMO) | Source: Ambulatory Visit | Attending: Radiation Oncology | Admitting: Radiation Oncology

## 2013-01-24 VITALS — BP 110/75 | HR 76 | Temp 98.8°F | Resp 20

## 2013-01-24 DIAGNOSIS — C61 Malignant neoplasm of prostate: Secondary | ICD-10-CM

## 2013-01-24 NOTE — Progress Notes (Signed)
Radiation Oncology         [phone] ________________________________  Name: Caleb Barajas MRN: [id number]  Date: 01/24/2013  DOB: Feb 17, 1951  Follow-Up Visit Note  CC: Caleb Moh, MD  [name], MD  Diagnosis:   62 y.o. gentleman with stage T1c adenocarcinoma of the prostate with a Gleason's score of 3 + 3 and a PSA of 5.66  Interval Since Last Radiation:  3  weeks  Narrative:  The patient returns today for routine follow-up.  He is complaining of increased urinary frequency and urinary hesitation symptoms. He filled out a questionnaire regarding urinary function today providing and overall IPSS score of 19 characterizing his symptoms as moderate.  His pre-implant score was 9. He denies any bowel symptoms.  ALLERGIES:  has No Known Allergies.  Meds: Current Outpatient Prescriptions  Medication Sig Dispense Refill  . docusate sodium (COLACE) 100 MG capsule Take 1 capsule (100 mg total) by mouth 2 (two) times daily.  60 capsule  2  . fish oil-omega-3 fatty acids 1000 MG capsule Take 2 g by mouth daily.      . folic acid (FOLVITE) 1 MG tablet Take 1 mg by mouth daily.      Marland Kitchen glucosamine-chondroitin 500-400 MG tablet Take 1 tablet by mouth 3 (three) times daily.      Marland Kitchen HYDROcodone-acetaminophen (NORCO) 5-325 MG per tablet Take 1 tablet by mouth every 6 (six) hours as needed for pain.  15 tablet  0  . methotrexate (RHEUMATREX) 2.5 MG tablet Take 2.5 mg by mouth once a week. Caution:Chemotherapy. Protect from light.      Marland Kitchen omeprazole (PRILOSEC) 20 MG capsule Take 20 mg by mouth daily.      Marland Kitchen pyridOXINE (VITAMIN B-6) 100 MG tablet Take 100 mg by mouth daily.      . saw palmetto 160 MG capsule Take 160 mg by mouth 2 (two) times daily.      . tamsulosin (FLOMAX) 0.4 MG CAPS capsule Take 1 capsule (0.4 mg total) by mouth daily.  30 capsule  1  . Testosterone (ANDROGEL) 20.25 MG/1.25GM (1.62%) GEL Place onto the skin.       No current facility-administered medications for  this encounter.    Physical Findings: The patient is in no acute distress. Patient is alert and oriented.  oral temperature is 98.8 F (37.1 C). His blood pressure is 110/75 and his pulse is 76. His respiration is 20. Marland Kitchen  No significant changes.  Lab Findings: Lab Results  Component Value Date   WBC 10.4 12/27/2012   HGB 12.2* 12/27/2012   HCT 36.3* 12/27/2012   MCV 91.9 12/27/2012   PLT 292 12/27/2012    Radiographic Findings:  Patient underwent CT imaging in our clinic for post implant dosimetry. The CT appears to demonstrate an adequate distribution of radioactive seeds throughout the prostate gland. There no seeds in her near the rectum. I suspect the final radiation plan and dosimetry will show appropriate coverage of the prostate gland.   Impression: The patient is recovering from the effects of radiation. His urinary symptoms should gradually improve over the next 4-6 months. We talked about this today. He is encouraged by his improvement already and is otherwise please with his outcome.   Plan: Today, I spent time talking to the patient about his prostate seed implant and resolving urinary symptoms. We also talked about long-term follow-up for prostate cancer following seed implant. He understands that ongoing PSA determinations and digital rectal exams will help  perform surveillance to rule out disease recurrence. He understands what to expect with his PSA measures. Patient was also educated today about some of the long-term effects would radiation including the Small risk for rectal bleeding and possibly erectile dysfunction. Talked about some of the general management approaches to these potential complications. However, I did encourage the patient to contact her office or return at any point if he has questions or concerns related to his previous radiation and prostate cancer.   _____________________________________  Artist Pais. Kathrynn Running, M.D.

## 2013-01-24 NOTE — Progress Notes (Signed)
Pt states he has generalized pain from rheumatoid arthritis, denies pain related to his prostate seed implant except when sitting for a long time on a hard chair. Pt states he is somewhat fatigued at the end of his work day. Pt reports urinary frequency, very slight dysuria, some urgency w/stream that starts and stops, weak stream, nocturia x 3. He denies bowel issues.  IPSS score today 19

## 2013-01-24 NOTE — Progress Notes (Signed)
  Radiation Oncology         [phone] ________________________________  Name: Caleb Barajas MRN: [id number]  Date: 01/24/2013  DOB: [birth year]  COMPLEX SIMULATION NOTE  NARRATIVE:  The patient was brought to the CT Simulation planning suite today following prostate seed implantation approximately one month ago.  Identity was confirmed.  All relevant records and images related to the planned course of therapy were reviewed.  Then, the patient was set-up supine.  CT images were obtained.  The CT images were loaded into the planning software.  Then the prostate and rectum were contoured.  Treatment planning then occurred.  The implanted iodine 125 seeds were identified by the physics staff for projection of radiation distribution  I have requested : 3D Simulation  I have requested a DVH of the following structures: Prostate and rectum.    ________________________________  Artist Pais Kathrynn Running, M.D.

## 2013-02-01 ENCOUNTER — Telehealth: Payer: Self-pay | Admitting: Dietician

## 2013-02-04 ENCOUNTER — Encounter: Payer: Self-pay | Admitting: Radiation Oncology

## 2013-02-04 NOTE — Progress Notes (Signed)
  Radiation Oncology         [phone] ________________________________  Name: Caleb Barajas  MRN: [id number]  Date: 02/04/2013  DOB: Mar 25, 1951  3-D Planning Note Prostate Brachytherapy  Diagnosis: 62 y.o. gentleman with stage T1c adenocarcinoma of the prostate with a Gleason's score of 3 + 3 and a PSA of 5.66  Narrative: On a previous date, Caleb Barajas returned following prostate seed implantation for post implant planning. He underwent CT scan complex simulation to delineate the three-dimensional structures of the pelvis and demonstrate the radiation distribution.  Since that time, the seed localization, and 3D planning with dose volume histograms have now been completed.  Results:   Prostate Coverage - The dose of radiation delivered to the 90% or more of the prostate gland (D90) was 113.9% of the prescription dose. This exceeds our goal of greater than 90%. Rectal Sparing - The volume of rectal tissue receiving the prescription dose or higher was 0.05 cc. This falls under our threshold tolerance of 1.0 cc.  Impression: The prostate seed implant appears to show adequate target coverage and appropriate rectal sparing.  Plan:  The patient will continue to follow with urology for ongoing PSA determinations. I would anticipate a high likelihood for local tumor control with minimal risk for rectal morbidity.  ________________________________  Artist Pais Kathrynn Running, M.D.

## 2013-03-14 ENCOUNTER — Other Ambulatory Visit: Payer: Self-pay

## 2015-04-13 ENCOUNTER — Ambulatory Visit (INDEPENDENT_AMBULATORY_CARE_PROVIDER_SITE_OTHER): Payer: Managed Care, Other (non HMO) | Admitting: Family Medicine

## 2015-04-13 ENCOUNTER — Telehealth: Payer: Self-pay

## 2015-04-13 VITALS — BP 122/68 | HR 81 | Temp 98.2°F | Resp 14 | Ht 70.75 in | Wt 201.0 lb

## 2015-04-13 DIAGNOSIS — R519 Headache, unspecified: Secondary | ICD-10-CM

## 2015-04-13 DIAGNOSIS — R51 Headache: Secondary | ICD-10-CM

## 2015-04-13 DIAGNOSIS — B029 Zoster without complications: Secondary | ICD-10-CM

## 2015-04-13 MED ORDER — HYDROCODONE-ACETAMINOPHEN 5-325 MG PO TABS: 1.0000 | ORAL_TABLET | Freq: Four times a day (QID) | ORAL | Status: DC | PRN

## 2015-04-13 MED ORDER — VALACYCLOVIR HCL 1 G PO TABS: 1000.0000 mg | ORAL_TABLET | Freq: Two times a day (BID) | ORAL | Status: DC

## 2015-04-13 MED ORDER — VALACYCLOVIR HCL 1 G PO TABS: 1000.0000 mg | ORAL_TABLET | Freq: Three times a day (TID) | ORAL | Status: DC

## 2015-04-13 NOTE — Telephone Encounter (Signed)
Called Pt. Pharmacy because Dr. Linna Darner decided to he wanted to write different instructions on how the Pt. Took his Valtrex. Instead of 2 times a day he changed it to 3 times a days a day

## 2015-04-13 NOTE — Patient Instructions (Addendum)
Take valacyclovir one pill 3 times daily  Use the Zostrix cream if needed for pain  Take ibuprofen 600-800 mg 3 times daily as needed for pain  Take hydrocodone pain pills for more severe pain every 4-6 hours if needed  Return if any problems or concerns, especially if any vision issues at all it is very important to return.   Shingles Shingles, which is also known as herpes zoster, is an infection that causes a painful skin rash and fluid-filled blisters. Shingles is not related to genital herpes, which is a sexually transmitted infection.   Shingles only develops in people who:  Have had chickenpox.  Have received the chickenpox vaccine. (This is rare.) CAUSES Shingles is caused by varicella-zoster virus (VZV). This is the same virus that causes chickenpox. After exposure to VZV, the virus stays in the body in an inactive (dormant) state. Shingles develops if the virus reactivates. This can happen many years after the initial exposure to VZV. It is not known what causes this virus to reactivate. RISK FACTORS People who have had chickenpox or received the chickenpox vaccine are at risk for shingles. Infection is more common in people who:  Are older than age 56.  Have a weakened defense (immune) system, such as those with HIV, AIDS, or cancer.  Are taking medicines that weaken the immune system, such as transplant medicines.  Are under great stress. SYMPTOMS Early symptoms of this condition include itching, tingling, and pain in an area on your skin. Pain may be described as burning, stabbing, or throbbing. A few days or weeks after symptoms start, a painful red rash appears, usually on one side of the body in a bandlike or beltlike pattern. The rash eventually turns into fluid-filled blisters that break open, scab over, and dry up in about 2-3 weeks. At any time during the infection, you may also develop:  A fever.  Chills.  A headache.  An upset stomach. DIAGNOSIS This  condition is diagnosed with a skin exam. Sometimes, skin or fluid samples are taken from the blisters before a diagnosis is made. These samples are examined under a microscope or sent to a lab for testing. TREATMENT There is no specific cure for this condition. Your health care provider will probably prescribe medicines to help you manage pain, recover more quickly, and avoid long-term problems. Medicines may include:  Antiviral drugs.  Anti-inflammatory drugs.  Pain medicines. If the area involved is on your face, you may be referred to a specialist, such as an eye doctor (ophthalmologist) or an ear, nose, and throat (ENT) doctor to help you avoid eye problems, chronic pain, or disability. HOME CARE INSTRUCTIONS Medicines  Take medicines only as directed by your health care provider.  Apply an anti-itch or numbing cream to the affected area as directed by your health care provider. Blister and Rash Care  Take a cool bath or apply cool compresses to the area of the rash or blisters as directed by your health care provider. This may help with pain and itching.  Keep your rash covered with a loose bandage (dressing). Wear loose-fitting clothing to help ease the pain of material rubbing against the rash.  Keep your rash and blisters clean with mild soap and cool water or as directed by your health care provider.  Check your rash every day for signs of infection. These include redness, swelling, and pain that lasts or increases.  Do not pick your blisters.  Do not scratch your rash. General Instructions  Rest as directed by your health care provider.  Keep all follow-up visits as directed by your health care provider. This is important.  Until your blisters scab over, your infection can cause chickenpox in people who have never had it or been vaccinated against it. To prevent this from happening, avoid contact with other people, especially:  Babies.  Pregnant women.  Children who  have eczema.  Elderly people who have transplants.  People who have chronic illnesses, such as leukemia or AIDS. SEEK MEDICAL CARE IF:  Your pain is not relieved with prescribed medicines.  Your pain does not get better after the rash heals.  Your rash looks infected. Signs of infection include redness, swelling, and pain that lasts or increases. SEEK IMMEDIATE MEDICAL CARE IF:  The rash is on your face or nose.  You have facial pain, pain around your eye area, or loss of feeling on one side of your face.  You have ear pain or you have ringing in your ear.  You have loss of taste.  Your condition gets worse.   This information is not intended to replace advice given to you by your health care provider. Make sure you discuss any questions you have with your health care provider.   Document Released: 04/25/2005 Document Revised: 05/16/2014 Document Reviewed: 03/06/2014 Elsevier Interactive Patient Education [employer].

## 2015-04-13 NOTE — Progress Notes (Signed)
Patient ID: Caleb Barajas, male    DOB: 08-Jan-1951  Age: 64 y.o. MRN: [id number]  Chief Complaint  Patient presents with  . Rash    Right side of forehead, x 4 days, tender to touch    Subjective:   64 year old man who's been breaking out with a rash on his right forehead and hairline for the past 3 or 4 days. It is a painful rash. He does suspect shingles. No visual impairment.  Current allergies, medications, problem list, past/family and social histories reviewed.  Objective:  BP 122/68 mmHg  Pulse 81  Temp(Src) 98.2 F (36.8 C) (Oral)  Resp 14  Ht 5' 10.75" (1.797 m)  Wt 201 lb (91.173 kg)  BMI 28.23 kg/m2  SpO2 97%  Pleasant and healthy in no acute distress. History of prostate cancer but that is in remission. Red rash with areas of thickening and early blistering scattered around his right forehead and about 6 or 8 cm into the hairline. Not crusted at all yet. Vision is fine.  Assessment & Plan:   Assessment: 1. Shingles rash   2. Temporal pain       Plan: Treat with antiviral and pain medication  No orders of the defined types were placed in this encounter.    Meds ordered this encounter  Medications  . hydroxychloroquine (PLAQUENIL) 200 MG tablet    Sig: Take by mouth daily.  Marland Kitchen sulfaSALAzine (AZULFIDINE) 500 MG tablet    Sig: Take 500 mg by mouth 4 (four) times daily.  Marland Kitchen DISCONTD: valACYclovir (VALTREX) 1000 MG tablet    Sig: Take 1 tablet (1,000 mg total) by mouth 2 (two) times daily.    Dispense:  20 tablet    Refill:  0  . HYDROcodone-acetaminophen (NORCO) 5-325 MG tablet    Sig: Take 1 tablet by mouth every 6 (six) hours as needed.    Dispense:  20 tablet    Refill:  0  . valACYclovir (VALTREX) 1000 MG tablet    Sig: Take 1 tablet (1,000 mg total) by mouth 3 (three) times daily.    Dispense:  21 tablet    Refill:  0         Patient Instructions  Shingles Shingles, which is also known as herpes zoster, is an infection that causes a  painful skin rash and fluid-filled blisters. Shingles is not related to genital herpes, which is a sexually transmitted infection.   Shingles only develops in people who:  Have had chickenpox.  Have received the chickenpox vaccine. (This is rare.) CAUSES Shingles is caused by varicella-zoster virus (VZV). This is the same virus that causes chickenpox. After exposure to VZV, the virus stays in the body in an inactive (dormant) state. Shingles develops if the virus reactivates. This can happen many years after the initial exposure to VZV. It is not known what causes this virus to reactivate. RISK FACTORS People who have had chickenpox or received the chickenpox vaccine are at risk for shingles. Infection is more common in people who:  Are older than age 66.  Have a weakened defense (immune) system, such as those with HIV, AIDS, or cancer.  Are taking medicines that weaken the immune system, such as transplant medicines.  Are under great stress. SYMPTOMS Early symptoms of this condition include itching, tingling, and pain in an area on your skin. Pain may be described as burning, stabbing, or throbbing. A few days or weeks after symptoms start, a painful red rash appears, usually on one side  of the body in a bandlike or beltlike pattern. The rash eventually turns into fluid-filled blisters that break open, scab over, and dry up in about 2-3 weeks. At any time during the infection, you may also develop:  A fever.  Chills.  A headache.  An upset stomach. DIAGNOSIS This condition is diagnosed with a skin exam. Sometimes, skin or fluid samples are taken from the blisters before a diagnosis is made. These samples are examined under a microscope or sent to a lab for testing. TREATMENT There is no specific cure for this condition. Your health care provider will probably prescribe medicines to help you manage pain, recover more quickly, and avoid long-term problems. Medicines may  include:  Antiviral drugs.  Anti-inflammatory drugs.  Pain medicines. If the area involved is on your face, you may be referred to a specialist, such as an eye doctor (ophthalmologist) or an ear, nose, and throat (ENT) doctor to help you avoid eye problems, chronic pain, or disability. HOME CARE INSTRUCTIONS Medicines  Take medicines only as directed by your health care provider.  Apply an anti-itch or numbing cream to the affected area as directed by your health care provider. Blister and Rash Care  Take a cool bath or apply cool compresses to the area of the rash or blisters as directed by your health care provider. This may help with pain and itching.  Keep your rash covered with a loose bandage (dressing). Wear loose-fitting clothing to help ease the pain of material rubbing against the rash.  Keep your rash and blisters clean with mild soap and cool water or as directed by your health care provider.  Check your rash every day for signs of infection. These include redness, swelling, and pain that lasts or increases.  Do not pick your blisters.  Do not scratch your rash. General Instructions  Rest as directed by your health care provider.  Keep all follow-up visits as directed by your health care provider. This is important.  Until your blisters scab over, your infection can cause chickenpox in people who have never had it or been vaccinated against it. To prevent this from happening, avoid contact with other people, especially:  Babies.  Pregnant women.  Children who have eczema.  Elderly people who have transplants.  People who have chronic illnesses, such as leukemia or AIDS. SEEK MEDICAL CARE IF:  Your pain is not relieved with prescribed medicines.  Your pain does not get better after the rash heals.  Your rash looks infected. Signs of infection include redness, swelling, and pain that lasts or increases. SEEK IMMEDIATE MEDICAL CARE IF:  The rash is on  your face or nose.  You have facial pain, pain around your eye area, or loss of feeling on one side of your face.  You have ear pain or you have ringing in your ear.  You have loss of taste.  Your condition gets worse.   This information is not intended to replace advice given to you by your health care provider. Make sure you discuss any questions you have with your health care provider.   Document Released: 04/25/2005 Document Revised: 05/16/2014 Document Reviewed: 03/06/2014 Elsevier Interactive Patient Education [employer].      Return if symptoms worsen or fail to improve.   Josephina Melcher, MD 04/13/2015

## 2016-03-07 ENCOUNTER — Other Ambulatory Visit: Payer: Self-pay | Admitting: Urology

## 2016-03-10 NOTE — Patient Instructions (Addendum)
Caleb Barajas  03/10/2016   Your procedure is scheduled on: 03/15/16  Report to [facility] Main  Entrance take [facility]  elevators to 3rd floor to  [place] at  0900 AM.  Call this number if you have problems the morning of surgery [phone]   Remember: ONLY 1 PERSON MAY GO WITH YOU TO SHORT STAY TO GET  READY MORNING OF YOUR SURGERY.  Do not eat food or drink liquids :After Midnight.     Take these medicines the morning of surgery with A SIP OF WATER: Omeprazole, Sertaline, Tamulosin DO NOT TAKE ANY DIABETIC MEDICATIONS DAY OF YOUR SURGERY                               You may not have any metal on your body including hair pins and              piercings  Do not wear jewelry, make-up, lotions, powders or perfumes, deodorant             Do not wear nail polish.  Do not shave  48 hours prior to surgery.              Men may shave face and neck.   Do not bring valuables to the hospital. [place].  Contacts, dentures or bridgework may not be worn into surgery.  Leave suitcase in the car. After surgery it may be brought to your room.     Patients discharged the day of surgery will not be allowed to drive home.  Name and phone number of your driver:  [name]  [phone]               Please read over the following fact sheets you were given: _____________________________________________________________________             [facility] - Preparing for Surgery Before surgery, you can play an important role.  Because skin is not sterile, your skin needs to be as free of germs as possible.  You can reduce the number of germs on your skin by washing with CHG (chlorahexidine gluconate) soap before surgery.  CHG is an antiseptic cleaner which kills germs and bonds with the skin to continue killing germs even after washing. Please DO NOT use if you have an allergy to CHG or antibacterial soaps.  If your skin  becomes reddened/irritated stop using the CHG and inform your nurse when you arrive at Short Stay. Do not shave (including legs and underarms) for at least 48 hours prior to the first CHG shower.  You may shave your face/neck. Please follow these instructions carefully:  1.  Shower with CHG Soap the night before surgery and the  morning of Surgery.  2.  If you choose to wash your hair, wash your hair first as usual with your  normal  shampoo.  3.  After you shampoo, rinse your hair and body thoroughly to remove the  shampoo.                           4.  Use CHG as you would any other liquid soap.  You can apply chg directly  to the  skin and wash                       Gently with a scrungie or clean washcloth.  5.  Apply the CHG Soap to your body ONLY FROM THE NECK DOWN.   Do not use on face/ open                           Wound or open sores. Avoid contact with eyes, ears mouth and genitals (private parts).                       Wash face,  Genitals (private parts) with your normal soap.             6.  Wash thoroughly, paying special attention to the area where your surgery  will be performed.  7.  Thoroughly rinse your body with warm water from the neck down.  8.  DO NOT shower/wash with your normal soap after using and rinsing off  the CHG Soap.                9.  Pat yourself dry with a clean towel.            10.  Wear clean pajamas.            11.  Place clean sheets on your bed the night of your first shower and do not  sleep with pets. Day of Surgery : Do not apply any lotions/deodorants the morning of surgery.  Please wear clean clothes to the hospital/surgery center.  FAILURE TO FOLLOW THESE INSTRUCTIONS MAY RESULT IN THE CANCELLATION OF YOUR SURGERY PATIENT SIGNATURE_________________________________  NURSE SIGNATURE__________________________________  ________________________________________________________________________

## 2016-03-11 ENCOUNTER — Encounter (HOSPITAL_COMMUNITY): Payer: Self-pay

## 2016-03-11 ENCOUNTER — Encounter (HOSPITAL_COMMUNITY)
Admission: RE | Admit: 2016-03-11 | Discharge: 2016-03-11 | Disposition: A | Discharge status: Home / Self Care | Payer: Managed Care, Other (non HMO) | Source: Ambulatory Visit | Attending: Urology | Admitting: Urology

## 2016-03-11 DIAGNOSIS — N359 Urethral stricture, unspecified: Secondary | ICD-10-CM | POA: Diagnosis not present

## 2016-03-11 DIAGNOSIS — Z01812 Encounter for preprocedural laboratory examination: Secondary | ICD-10-CM | POA: Insufficient documentation

## 2016-03-11 HISTORY — DX: Unspecified asthma, uncomplicated: J45.909

## 2016-03-11 LAB — BASIC METABOLIC PANEL
ANION GAP: 6 (ref 5–15)
BUN: 13 mg/dL (ref 6–20)
CALCIUM: 9.3 mg/dL (ref 8.9–10.3)
CHLORIDE: 106 mmol/L (ref 101–111)
CO2: 27 mmol/L (ref 22–32)
Creatinine, Ser: 1 mg/dL (ref 0.61–1.24)
GFR calc Af Amer: >60 mL/min (ref >60)
GFR calc non Af Amer: >60 mL/min (ref >60)
GLUCOSE: 101 mg/dL — AB (ref 65–99)
POTASSIUM: 4.6 mmol/L (ref 3.5–5.1)
Sodium: 139 mmol/L (ref 135–145)

## 2016-03-11 LAB — CBC
HEMATOCRIT: 42.1 % (ref 39.0–52.0)
HEMOGLOBIN: 14.5 g/dL (ref 13.0–17.0)
MCH: 36.3 pg — ABNORMAL HIGH (ref 26.0–34.0)
MCHC: 34.4 g/dL (ref 30.0–36.0)
MCV: 105.3 fL — AB (ref 78.0–100.0)
Platelets: 144 10*3/uL — ABNORMAL LOW (ref 150–400)
RBC: 4 MIL/uL — ABNORMAL LOW (ref 4.22–5.81)
RDW: 14.2 % (ref 11.5–15.5)
WBC: 8 10*3/uL (ref 4.0–10.5)

## 2016-03-14 NOTE — H&P (Signed)
CC/HPI: I have blood in my urine.     Caleb Barajas returns today for cystoscopy following a CT for hematuria. He had some bladder wall thickening but no other GU findings other than the seeds in the prostate. He has significant reduction in the urinary stream.     ALLERGIES: No Allergies    MEDICATIONS: Androgel 20.25 mg/1.25 gram per actuation (1.62 %) gel in metered-dose pump 5 gram Transdermal Daily  AndroGel Pump 20.25 MG/ACT (1.62%) Transdermal Gel 0 Transdermal  Cialis 5 MG Oral Tablet 0 Oral  Fish Oil CAPS Oral  Folic Acid CAPS Oral  Hydroxychloroquine Sulfate 200 MG Oral Tablet Oral  Methotrexate 2.5 MG Oral Tablet Oral  Pantoprazole Sodium 40 MG Oral Tablet Delayed Release Oral  Saw Palmetto CAPS Oral  Sertraline HCl - 50 MG Oral Tablet Oral  Sildenafil Citrate 20 MG Oral Tablet 0 Oral  SulfaSALAzine 500 MG Oral Tablet Oral  Tamsulosin HCl - 0.4 MG Oral Capsule 2 Oral Daily  Vitamin B Complex TABS Oral  Vitamin D TABS Oral     GU PSH: Locm 300-399Mg /Ml Iodine,1Ml - 02/24/2016 TRANSPERI NEEDLE PLACE, PROS - Aug 20, 2012      PSH Notes: Surgery Prostate Transperineal Placement Of Needles, Inguinal Hernia Repair   NON-GU PSH: None   GU PMH: Gross hematuria, Recent onset and probably from radiation effect but evaluation needed. - 02/17/2016 Prostate Cancer, History (Improving), His PSA continues to fall. - 02/17/2016, History of malignant neoplasm of prostate, - 08/05/2015 BPH w/LUTS, Benign prostatic hyperplasia with urinary obstruction - 08/05/2015 ED, arterial insufficiency, Erectile dysfunction due to arterial insufficiency - 08/05/2015 Nocturia, Nocturia - 08/05/2015 Testicular hypofunction, Hypogonadism, testicular - 08/05/2015      PMH Notes:  2012-12-27 08:28:16 - Note: Visit For: Preoperative Urological Exam   NON-GU PMH: Encounter for general adult medical examination without abnormal findings, Encounter for preventive health examination - August 20, 2012 Personal history of other  endocrine, nutritional and metabolic disease, History of hyperglycemia - 20-Aug-2012, History of hypercholesterolemia, - 20-Aug-2012 Asthma, Asthma - 2012-08-20 Personal history of other diseases of the digestive system, History of esophageal reflux - 2012/08/20 Personal history of other diseases of the nervous system and sense organs, History of sleep apnea - Aug 20, 2012 Rheumatoid Arthritis, Rheumatoid Arthritis - 20-Aug-2012    FAMILY HISTORY: Death In The Family Father - Runs In Family Death In The Family Mother - Runs In Family Family Health Status Number - Runs In Family Prostate Cancer - Father   SOCIAL HISTORY: None    Notes: Former smoker, Former Research scientist (life sciences), Occupation:, Alcohol Use, Marital History - Currently Married   REVIEW OF SYSTEMS:    GU Review Male:   Patient reports erection problems, frequent urination, hard to postpone urination, and get up at night to urinate. Patient denies penile pain, stream starts and stops, burning/ pain with urination, trouble starting your stream, have to strain to urinate , and leakage of urine.  Gastrointestinal (Upper):   Patient denies nausea, vomiting, and indigestion/ heartburn.  Gastrointestinal (Lower):   Patient denies diarrhea and constipation.  Constitutional:   Patient denies fever, night sweats, weight loss, and fatigue.  Skin:   Patient denies skin rash/ lesion and itching.  Eyes:   Patient denies blurred vision and double vision.  Ears/ Nose/ Throat:   Patient denies sore throat and sinus problems.  Hematologic/Lymphatic:   Patient denies swollen glands and easy bruising.  Cardiovascular:   Patient denies leg swelling and chest pains.  Respiratory:   Patient denies cough and shortness of breath.  Endocrine:   Patient denies excessive thirst.  Musculoskeletal:   Patient denies back pain and joint pain.  Neurological:   Patient denies headaches and dizziness.  Psychologic:   Patient denies depression and anxiety.   VITAL SIGNS: None   MULTI-SYSTEM PHYSICAL EXAMINATION:     Constitutional: Well-nourished. No physical deformities. Normally developed. Good grooming.  Respiratory: No labored breathing, no use of accessory muscles. CTA  Cardiovascular: Normal temperature, RRR without murmur.     PAST DATA REVIEWED:  Source Of History:  Patient  Urine Test Review:   Urinalysis  X-Ray Review: C.T. Hematuria: Reviewed Films. Reviewed Report. bladder wall thickening.    02/10/16 07/30/15 02/03/15 07/18/14 04/30/14 10/29/13 04/24/13 10/15/12  PSA  Total PSA 0.11 ng/dl 0.57  1.23  0.98  1.34  1.84  4.57  5.66     02/17/16 07/29/15 02/03/15 08/21/14 07/17/14 04/30/14 10/15/12 09/06/12  Hormones  Testosterone, Total 188.8 pg/dL 153  1329  575  160  192  130  145     PROCEDURES:         Flexible Cystoscopy - 52000  Risks, benefits, and some of the potential complications of the procedure were discussed.  56ml of 2% lidocaine jelly was instilled intraurethrally.  Cipro 500mg  given for antibiotic prophylaxis.    Meatus:  Normal size. Normal location. Normal condition.  Urethra:  No strictures.  External Sphincter:  was not passable from a probable stricture  Prostate:  not visualized  Bladder:  no visualized      The procedure was well tolerated and there were no complications.  He has a stricture at the membranous urethra.         Urinalysis Dipstick Dipstick Cont'd  Color: Yellow Bilirubin: Neg  Appearance: Clear Ketones: Neg  Specific Gravity: 1.020 Blood: Neg  pH: 6.0 Protein: Neg  Glucose: Neg Urobilinogen: 0.2    Nitrites: Neg    Leukocyte Esterase: Neg    ASSESSMENT:      ICD-10 Details  1 GU:   Asymptomatic microscopic hematuria - R31.21   2   Postprocedural membranous urethral stricture - [id number]    PLAN:           Schedule Return Visit: Next Available Appointment - Schedule Surgery          Document Letter(s):  Created for Patient: Clinical Summary         Notes:   He appears to have a membranous stricture and has a reduced  stream.   I am going to get him set up for cystoscopy with urethral dilation. I have reviewed the risks of bleeding, infection, urethral injury, recurrent stricture, incontinence, thrombotic events and anesthetic complications.   CC: Dr. Deland Pretty.

## 2016-03-15 ENCOUNTER — Encounter (HOSPITAL_COMMUNITY): Payer: Self-pay | Admitting: *Deleted

## 2016-03-15 ENCOUNTER — Ambulatory Visit (HOSPITAL_COMMUNITY): Payer: Managed Care, Other (non HMO) | Admitting: Certified Registered Nurse Anesthetist

## 2016-03-15 ENCOUNTER — Ambulatory Visit (HOSPITAL_COMMUNITY)
Admission: RE | Admit: 2016-03-15 | Discharge: 2016-03-15 | Disposition: A | Discharge status: Home / Self Care | Payer: Managed Care, Other (non HMO) | Source: Ambulatory Visit | Attending: Urology | Admitting: Urology

## 2016-03-15 ENCOUNTER — Encounter (HOSPITAL_COMMUNITY)
Admission: RE | Disposition: A | Discharge status: Home / Self Care | Payer: Self-pay | Source: Ambulatory Visit | Attending: Urology

## 2016-03-15 DIAGNOSIS — Z923 Personal history of irradiation: Secondary | ICD-10-CM | POA: Diagnosis not present

## 2016-03-15 DIAGNOSIS — K219 Gastro-esophageal reflux disease without esophagitis: Secondary | ICD-10-CM | POA: Diagnosis not present

## 2016-03-15 DIAGNOSIS — N99112 Postprocedural membranous urethral stricture: Secondary | ICD-10-CM | POA: Insufficient documentation

## 2016-03-15 DIAGNOSIS — G4730 Sleep apnea, unspecified: Secondary | ICD-10-CM | POA: Insufficient documentation

## 2016-03-15 DIAGNOSIS — N3289 Other specified disorders of bladder: Secondary | ICD-10-CM | POA: Insufficient documentation

## 2016-03-15 DIAGNOSIS — Z8546 Personal history of malignant neoplasm of prostate: Secondary | ICD-10-CM | POA: Insufficient documentation

## 2016-03-15 DIAGNOSIS — R3129 Other microscopic hematuria: Secondary | ICD-10-CM | POA: Diagnosis not present

## 2016-03-15 DIAGNOSIS — Z87891 Personal history of nicotine dependence: Secondary | ICD-10-CM | POA: Diagnosis not present

## 2016-03-15 HISTORY — PX: CYSTOSCOPY WITH URETHRAL DILATATION: SHX5125

## 2016-03-15 SURGERY — CYSTOSCOPY, WITH URETHRAL DILATION
Anesthesia: General

## 2016-03-15 MED ORDER — ONDANSETRON HCL 4 MG/2ML IJ SOLN
INTRAMUSCULAR | Status: AC
Start: 2016-03-15 — End: 2016-03-15
  Filled 2016-03-15: qty 2

## 2016-03-15 MED ORDER — SODIUM CHLORIDE 0.9% FLUSH: 3.0000 mL | Freq: Two times a day (BID) | INTRAVENOUS | Status: DC

## 2016-03-15 MED ORDER — DEXAMETHASONE SODIUM PHOSPHATE 10 MG/ML IJ SOLN
INTRAMUSCULAR | Status: DC | PRN
  Administered 2016-03-15: 10 mg via INTRAVENOUS

## 2016-03-15 MED ORDER — FENTANYL CITRATE (PF) 100 MCG/2ML IJ SOLN: 25.0000 ug | INTRAMUSCULAR | Status: DC | PRN

## 2016-03-15 MED ORDER — LACTATED RINGERS IV SOLN
INTRAVENOUS | Status: DC
  Administered 2016-03-15: 1000 mL via INTRAVENOUS

## 2016-03-15 MED ORDER — LIDOCAINE 2% (20 MG/ML) 5 ML SYRINGE
INTRAMUSCULAR | Status: DC | PRN
  Administered 2016-03-15: 100 mg via INTRAVENOUS

## 2016-03-15 MED ORDER — PROMETHAZINE HCL 25 MG/ML IJ SOLN: 6.2500 mg | INTRAMUSCULAR | Status: DC | PRN

## 2016-03-15 MED ORDER — SODIUM CHLORIDE 0.9% FLUSH: 3.0000 mL | INTRAVENOUS | Status: DC | PRN

## 2016-03-15 MED ORDER — ONDANSETRON HCL 4 MG/2ML IJ SOLN
INTRAMUSCULAR | Status: DC | PRN
  Administered 2016-03-15: 4 mg via INTRAVENOUS

## 2016-03-15 MED ORDER — SODIUM CHLORIDE 0.9 % IV SOLN: 250.0000 mL | INTRAVENOUS | Status: DC | PRN

## 2016-03-15 MED ORDER — KETOROLAC TROMETHAMINE 30 MG/ML IJ SOLN: 30.0000 mg | Freq: Once | INTRAMUSCULAR | Status: DC | PRN

## 2016-03-15 MED ORDER — PROPOFOL 10 MG/ML IV BOLUS
INTRAVENOUS | Status: DC | PRN
  Administered 2016-03-15: 200 mg via INTRAVENOUS

## 2016-03-15 MED ORDER — FENTANYL CITRATE (PF) 100 MCG/2ML IJ SOLN
INTRAMUSCULAR | Status: AC
  Filled 2016-03-15: qty 2

## 2016-03-15 MED ORDER — SODIUM CHLORIDE 0.9 % IR SOLN
Status: DC | PRN
  Administered 2016-03-15: 3000 mL/h via INTRAVESICAL

## 2016-03-15 MED ORDER — CEFAZOLIN SODIUM-DEXTROSE 2-4 GM/100ML-% IV SOLN
INTRAVENOUS | Status: AC
  Filled 2016-03-15: qty 100

## 2016-03-15 MED ORDER — DEXAMETHASONE SODIUM PHOSPHATE 10 MG/ML IJ SOLN
INTRAMUSCULAR | Status: AC
  Filled 2016-03-15: qty 1

## 2016-03-15 MED ORDER — CEFAZOLIN SODIUM-DEXTROSE 2-4 GM/100ML-% IV SOLN
2.0000 g | INTRAVENOUS | Status: AC
  Administered 2016-03-15: 2 g via INTRAVENOUS
  Filled 2016-03-15: qty 100

## 2016-03-15 MED ORDER — LIDOCAINE 2% (20 MG/ML) 5 ML SYRINGE
INTRAMUSCULAR | Status: AC
  Filled 2016-03-15: qty 5

## 2016-03-15 MED ORDER — ACETAMINOPHEN 325 MG PO TABS: 650.0000 mg | ORAL_TABLET | ORAL | Status: DC | PRN

## 2016-03-15 MED ORDER — FENTANYL CITRATE (PF) 100 MCG/2ML IJ SOLN
INTRAMUSCULAR | Status: DC | PRN
  Administered 2016-03-15 (×2): 50 ug via INTRAVENOUS

## 2016-03-15 MED ORDER — ACETAMINOPHEN 650 MG RE SUPP: 650.0000 mg | RECTAL | Status: DC | PRN

## 2016-03-15 MED ORDER — OXYCODONE HCL 5 MG PO TABS
5.0000 mg | ORAL_TABLET | ORAL | Status: DC | PRN
  Administered 2016-03-15: 5 mg via ORAL
  Filled 2016-03-15: qty 1

## 2016-03-15 MED ORDER — PROPOFOL 10 MG/ML IV BOLUS
INTRAVENOUS | Status: AC
  Filled 2016-03-15: qty 20

## 2016-03-15 MED ORDER — TRAMADOL HCL 50 MG PO TABS: 50.0000 mg | ORAL_TABLET | Freq: Four times a day (QID) | ORAL | 0 refills | Status: DC | PRN

## 2016-03-15 SURGICAL SUPPLY — 14 items
BAG URINE DRAINAGE (UROLOGICAL SUPPLIES) ×3 IMPLANT
BAG URO CATCHER STRL LF (MISCELLANEOUS) ×3 IMPLANT
BALLN NEPHROSTOMY (BALLOONS) ×3
BALLOON NEPHROSTOMY (BALLOONS) ×1 IMPLANT
CATH FOLEY 2W COUNCIL 5CC 18FR (CATHETERS) ×3 IMPLANT
CATH URET 5FR 28IN OPEN ENDED (CATHETERS) IMPLANT
CLOTH BEACON ORANGE TIMEOUT ST (SAFETY) ×3 IMPLANT
GLOVE SURG SS PI 8.0 STRL IVOR (GLOVE) ×3 IMPLANT
GOWN STRL REUS W/TWL XL LVL3 (GOWN DISPOSABLE) ×3 IMPLANT
GUIDEWIRE STR DUAL SENSOR (WIRE) ×3 IMPLANT
MANIFOLD NEPTUNE II (INSTRUMENTS) ×3 IMPLANT
PACK CYSTO (CUSTOM PROCEDURE TRAY) ×3 IMPLANT
TUBING CONNECTING 10 (TUBING) ×2 IMPLANT
TUBING CONNECTING 10' (TUBING) ×1

## 2016-03-15 NOTE — Anesthesia Procedure Notes (Signed)
Procedure Name: LMA Insertion Date/Time: 03/15/2016 11:10 AM Performed by: Montel Clock Pre-anesthesia Checklist: Patient identified, Emergency Drugs available, Suction available, Patient being monitored and Timeout performed Patient Re-evaluated:Patient Re-evaluated prior to inductionOxygen Delivery Method: Circle system utilized Preoxygenation: Pre-oxygenation with 100% oxygen Intubation Type: IV induction Ventilation: Mask ventilation without difficulty LMA: LMA with gastric port inserted LMA Size: 5.0 Number of attempts: 1 Dental Injury: Teeth and Oropharynx as per pre-operative assessment

## 2016-03-15 NOTE — Transfer of Care (Signed)
Immediate Anesthesia Transfer of Care Note  Patient: Caleb Barajas  Procedure(s) Performed: Procedure(s): CYSTOSCOPY WITH URETHRAL DILATATION OF STRICTURE (N/A)  Patient Location: PACU  Anesthesia Type:General  Level of Consciousness:  sedated, patient cooperative and responds to stimulation  Airway & Oxygen Therapy:Patient Spontanous Breathing and Patient connected to face mask oxgen  Post-op Assessment:  Report given to PACU RN and Post -op Vital signs reviewed and stable  Post vital signs:  Reviewed and stable  Last Vitals:  Vitals:   03/15/16 0902  BP: 139/85  Pulse: 65  Resp: 16  Temp: 123XX123 C    Complications: No apparent anesthesia complications

## 2016-03-15 NOTE — Discharge Instructions (Addendum)
PATIENT INSTRUCTIONS °POST-ANESTHESIA ° °IMMEDIATELY FOLLOWING SURGERY:  Do not drive or operate machinery for the first twenty four hours after surgery.  Do not make any important decisions for twenty four hours after surgery or while taking narcotic pain medications or sedatives.  If you develop intractable nausea and vomiting or a severe headache please notify your doctor immediately. ° °FOLLOW-UP:  Please make an appointment with your surgeon as instructed. You do not need to follow up with anesthesia unless specifically instructed to do so. ° °WOUND CARE INSTRUCTIONS (if applicable):  Keep a dry clean dressing on the anesthesia/puncture wound site if there is drainage.  Once the wound has quit draining you may leave it open to air.  Generally you should leave the bandage intact for twenty four hours unless there is drainage.  If the epidural site drains for more than 36-48 hours please call the anesthesia department. ° °QUESTIONS?:  Please feel free to call your physician or the hospital operator if you have any questions, and they will be happy to assist you.    ° ° °CYSTOSCOPY HOME CARE INSTRUCTIONS ° °Activity: °Rest for the remainder of the day.  Do not drive or operate equipment today.  You may resume normal activities in one to two days as instructed by your physician.  ° °Meals: °Drink plenty of liquids and eat light foods such as gelatin or soup this evening.  You may return to a normal meal plan tomorrow. ° °Return to Work: °You may return to work in one to two days or as instructed by your physician. ° °Special Instructions / Symptoms: °Call your physician if any of these symptoms occur: ° ° -persistent or heavy bleeding ° -bleeding which continues after first few urination ° -large blood clots that are difficult to pass ° -urine stream diminishes or stops completely ° -fever equal to or higher than 101 degrees Farenheit. ° -cloudy urine with a strong, foul odor ° -severe pain ° °Females should always  wipe from front to back after elimination.  You may feel some burning pain when you urinate.  This should disappear with time.  Applying moist heat to the lower abdomen or a hot tub bath may help relieve the pain. \ ° ° ° °Patient Signature:  ________________________________________________________ ° °Nurse's Signature:  ________________________________________________________ ° °

## 2016-03-15 NOTE — Interval H&P Note (Signed)
History and Physical Interval Note:  03/15/2016 10:42 AM  Caleb Barajas  has presented today for surgery, with the diagnosis of URETHRAL STRICTURE  The various methods of treatment have been discussed with the patient and family. After consideration of risks, benefits and other options for treatment, the patient has consented to  Procedure(s): CYSTOSCOPY WITH URETHRAL DILATATION OF STRICTURE (N/A) as a surgical intervention .  The patient's history has been reviewed, patient examined, no change in status, stable for surgery.  I have reviewed the patient's chart and labs.  Questions were answered to the patient's satisfaction.     Johnathan Heskett,[name]

## 2016-03-15 NOTE — Anesthesia Postprocedure Evaluation (Signed)
Anesthesia Post Note  Patient: Caleb Barajas  Procedure(s) Performed: Procedure(s) (LRB): CYSTOSCOPY WITH URETHRAL DILATATION OF STRICTURE (N/A)  Patient location during evaluation: PACU Anesthesia Type: General Level of consciousness: awake and alert Pain management: pain level controlled Vital Signs Assessment: post-procedure vital signs reviewed and stable Respiratory status: spontaneous breathing, nonlabored ventilation, respiratory function stable and patient connected to nasal cannula oxygen Cardiovascular status: blood pressure returned to baseline and stable Postop Assessment: no signs of nausea or vomiting Anesthetic complications: no    Last Vitals:  Vitals:   03/15/16 0902 03/15/16 1148  BP: 139/85 137/77  Pulse: 65 65  Resp: 16 10  Temp: 36.7 C 36.4 C    Last Pain:  Vitals:   03/15/16 1148  TempSrc:   PainSc: 0-No pain                 Brenly Trawick S

## 2016-03-15 NOTE — Anesthesia Preprocedure Evaluation (Signed)
Anesthesia Evaluation  Patient identified by MRN, date of birth, ID band Patient awake    Reviewed: Allergy & Precautions, NPO status , Patient's Chart, lab work & pertinent test results  Airway Mallampati: II  TM Distance: >3 FB Neck ROM: Full    Dental no notable dental hx.    Pulmonary sleep apnea , former smoker,    Pulmonary exam normal breath sounds clear to auscultation       Cardiovascular negative cardio ROS Normal cardiovascular exam Rhythm:Regular Rate:Normal     Neuro/Psych negative neurological ROS  negative psych ROS   GI/Hepatic Neg liver ROS, GERD  Medicated,  Endo/Other  negative endocrine ROS  Renal/GU negative Renal ROS  negative genitourinary   Musculoskeletal negative musculoskeletal ROS (+)   Abdominal   Peds negative pediatric ROS (+)  Hematology negative hematology ROS (+)   Anesthesia Other Findings   Reproductive/Obstetrics negative OB ROS                             Anesthesia Physical Anesthesia Plan  ASA: II  Anesthesia Plan: General   Post-op Pain Management:    Induction: Intravenous  Airway Management Planned: LMA  Additional Equipment:   Intra-op Plan:   Post-operative Plan: Extubation in OR  Informed Consent: I have reviewed the patients History and Physical, chart, labs and discussed the procedure including the risks, benefits and alternatives for the proposed anesthesia with the patient or authorized representative who has indicated his/her understanding and acceptance.   Dental advisory given  Plan Discussed with: CRNA and Surgeon  Anesthesia Plan Comments:         Anesthesia Quick Evaluation

## 2016-03-15 NOTE — Brief Op Note (Signed)
03/15/2016  11:36 AM  PATIENT:  Caleb Barajas  65 y.o. male  PRE-OPERATIVE DIAGNOSIS:  URETHRAL STRICTURE  POST-OPERATIVE DIAGNOSIS:  URETHRAL STRICTURE  PROCEDURE:  Procedure(s): CYSTOSCOPY WITH URETHRAL DILATATION OF STRICTURE (N/A)  SURGEON:  Surgeon(s) and Role:    * Irine Seal, MD - Primary  PHYSICIAN ASSISTANT:   ASSISTANTS: none   ANESTHESIA:   general  EBL:  No intake/output data recorded.  BLOOD ADMINISTERED:none  DRAINS: Urinary Catheter (Foley)   LOCAL MEDICATIONS USED:  None  SPECIMEN:  No Specimen  DISPOSITION OF SPECIMEN:  N/A  COUNTS:  YES  TOURNIQUET:  * No tourniquets in log *  DICTATION: .Other Dictation: Dictation Number [id number]  PLAN OF CARE: Discharge to home after PACU  PATIENT DISPOSITION:  PACU - hemodynamically stable.   Delay start of Pharmacological VTE agent (>24hrs) due to surgical blood loss or risk of bleeding: not applicable

## 2016-03-16 NOTE — Op Note (Signed)
Caleb Barajas, Caleb Barajas NO.:  [id number]  MEDICAL RECORD NO.:  [id number]  LOCATION:  [facility]                         FACILITY:  [facility]  PHYSICIAN:  Kalief Kingham. Jeffie Pollock, M.D.    DATE OF BIRTH:  Jun 10, 1950  DATE OF PROCEDURE:  03/15/2016 DATE OF DISCHARGE:  03/15/2016                              OPERATIVE REPORT   PROCEDURE:  Cystoscopy with dilation of membranous urethral stricture.  PREOPERATIVE DIAGNOSIS:  Membranous urethral stricture with hematuria.  POSTOPERATIVE DIAGNOSIS:  Membranous urethral stricture with hematuria.  SURGEON:  Keisean Seaberg. Jeffie Pollock, M.D.  ANESTHESIA:  General.  SPECIMEN:  None.  DRAINS:  An 18-French Council catheter.  BLOOD LOSS:  Minimal.  COMPLICATIONS:  None.  INDICATIONS:  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  He is going to undergo cystoscopy with dilation of the stricture.  FINDINGS AND PROCEDURE:  He was taken to the operating room where general anesthetic was induced.  He was given 2 g of Ancef.  He was placed in lithotomy position and was fitted with PAS hose.  His perineum and genitalia were prepped with Betadine solution and was draped in usual sterile fashion.  Cystoscopy was performed using a 21-French scope and 30-degree lens. Examination revealed a normal urethra up to the external sphincter where there was some blanching and I was unable to pass the scope.  A guidewire was then passed through the scope and into the bladder.  I was able to advance the scope over the wire into the bladder.  There was patent prostatic fossa with mild edema at the bladder neck.  Examination of the bladder revealed mild-to-moderate trabeculation.  No tumors, stones or inflammation were noted.  Ureteral orifices were unremarkable.  The cystoscope was then removed and a 15-French 24-cm balloon  dilation catheter was then inserted.  This was inflated to 18 atmospheres of pressure and then deflated.  Inspection revealed disruption of the membranous sphincter.  I did want to over-dilate for fear of potential incontinence.  There was a little bleeding from the site.  An 18-French Council catheter was placed and this will be left indwelling 1-hour after the procedure.  The patient was taken down from lithotomy position.  His anesthetic was reversed.  He was moved to the recovery room in stable condition.  There were no complications.     Marshall Cork. Jeffie Pollock, M.D.     JJW/MEDQ  D:  03/15/2016  T:  03/16/2016  Job:  [id number]

## 2017-07-31 ENCOUNTER — Other Ambulatory Visit: Payer: Self-pay | Admitting: Acute Care

## 2017-07-31 DIAGNOSIS — F17210 Nicotine dependence, cigarettes, uncomplicated: Principal | ICD-10-CM

## 2017-07-31 DIAGNOSIS — Z122 Encounter for screening for malignant neoplasm of respiratory organs: Secondary | ICD-10-CM

## 2017-08-09 ENCOUNTER — Encounter: Payer: Self-pay | Admitting: Acute Care

## 2017-08-09 ENCOUNTER — Ambulatory Visit (INDEPENDENT_AMBULATORY_CARE_PROVIDER_SITE_OTHER)
Admission: RE | Admit: 2017-08-09 | Discharge: 2017-08-09 | Disposition: A | Discharge status: Home / Self Care | Payer: Managed Care, Other (non HMO) | Source: Ambulatory Visit | Attending: Acute Care | Admitting: Acute Care

## 2017-08-09 ENCOUNTER — Ambulatory Visit (INDEPENDENT_AMBULATORY_CARE_PROVIDER_SITE_OTHER): Payer: Managed Care, Other (non HMO) | Admitting: Acute Care

## 2017-08-09 DIAGNOSIS — Z122 Encounter for screening for malignant neoplasm of respiratory organs: Secondary | ICD-10-CM

## 2017-08-09 DIAGNOSIS — F17210 Nicotine dependence, cigarettes, uncomplicated: Secondary | ICD-10-CM | POA: Diagnosis not present

## 2017-08-09 NOTE — Progress Notes (Signed)
Shared Decision Making Visit Lung Cancer Screening Program [phone])   Eligibility:  Age 67 y.o.  Pack Years Smoking History Calculation 35 pack year smoking history (# packs/per year x # years smoked)  Recent History of coughing up blood  no  Unexplained weight loss? no ( >Than 15 pounds within the last 6 months )  Prior History Lung / other cancer no (Diagnosis within the last 5 years already requiring surveillance chest CT Scans).  Smoking Status Current Smoker  Former Smokers: Years since quit: NA  Quit Date: NA  Visit Components:  Discussion included one or more decision making aids. yes  Discussion included risk/benefits of screening. yes  Discussion included potential follow up diagnostic testing for abnormal scans. yes  Discussion included meaning and risk of over diagnosis. yes  Discussion included meaning and risk of False Positives. yes  Discussion included meaning of total radiation exposure. yes  Counseling Included:  Importance of adherence to annual lung cancer LDCT screening. yes  Impact of comorbidities on ability to participate in the program. no  Ability and willingness to under diagnostic treatment. yes  Smoking Cessation Counseling:  Current Smokers:   Discussed importance of smoking cessation. yes  Information about tobacco cessation classes and interventions provided to patient. yes  Patient provided with "ticket" for LDCT Scan. yes  Symptomatic Patient. no  Counseling  Diagnosis Code: Tobacco Use Z72.0  Asymptomatic Patient yes  Counseling (Intermediate counseling: > three minutes counseling) I3382  Former Smokers:   Discussed the importance of maintaining cigarette abstinence. yes  Diagnosis Code: Personal History of Nicotine Dependence. N05.397  Information about tobacco cessation classes and interventions provided to patient. Yes  Patient provided with "ticket" for LDCT Scan. yes  Written Order for Lung Cancer  Screening with LDCT placed in Epic. Yes (CT Chest Lung Cancer Screening Low Dose W/O CM) [id number] Z12.2-Screening of respiratory organs Z87.891-Personal history of nicotine dependence  I have spent 25 minutes of face to face time with Mr. Steinfeldt discussing the risks and benefits of lung cancer screening. We viewed a power point together that explained in detail the above noted topics. We paused at intervals to allow for questions to be asked and answered to ensure understanding.We discussed that the single most powerful action that he can take to decrease his risk of developing lung cancer is to quit smoking. We discussed whether or not he is ready to commit to setting a quit date. We discussed options for tools to aid in quitting smoking including nicotine replacement therapy, non-nicotine medications, support groups, Quit Smart classes, and behavior modification. We discussed that often times setting smaller, more achievable goals, such as eliminating 1 cigarette a day for a week and then 2 cigarettes a day for a week can be helpful in slowly decreasing the number of cigarettes smoked. This allows for a sense of accomplishment as well as providing a clinical benefit. I gave him the " Be Stronger Than Your Excuses" card with contact information for community resources, classes, free nicotine replacement therapy, and access to mobile apps, text messaging, and on-line smoking cessation help. I have also given him my card and contact information in the event he needs to contact me. We discussed the time and location of the scan, and that either Doroteo Glassman RN or I will call with the results within 24-48 hours of receiving them. I have offered him  a copy of the power point we viewed  as a resource in the event they need reinforcement of  the concepts we discussed today in the office. The patient verbalized understanding of all of  the above and had no further questions upon leaving the office. They have my  contact information in the event they have any further questions.  I spent 4 minutes counseling on smoking cessation and the health risks of continued tobacco abuse.  I explained to the patient that there has been a high incidence of coronary artery disease noted on these exams. I explained that this is a non-gated exam therefore degree or severity cannot be determined. This patient is not on statin therapy. I have asked the patient to follow-up with their PCP regarding any incidental finding of coronary artery disease and management with diet or medication as their PCP  feels is clinically indicated. The patient verbalized understanding of the above and had no further questions upon completion of the visit.      Magdalen Spatz, NP 08/09/2017 9:33 AM

## 2017-08-15 ENCOUNTER — Other Ambulatory Visit: Payer: Self-pay | Admitting: Acute Care

## 2017-08-15 DIAGNOSIS — F17210 Nicotine dependence, cigarettes, uncomplicated: Principal | ICD-10-CM

## 2017-08-15 DIAGNOSIS — Z122 Encounter for screening for malignant neoplasm of respiratory organs: Secondary | ICD-10-CM

## 2018-01-30 ENCOUNTER — Ambulatory Visit
Admission: RE | Admit: 2018-01-30 | Discharge: 2018-01-30 | Disposition: A | Discharge status: Home / Self Care | Payer: Managed Care, Other (non HMO) | Source: Ambulatory Visit | Attending: Family Medicine | Admitting: Family Medicine

## 2018-01-30 ENCOUNTER — Other Ambulatory Visit: Payer: Self-pay | Admitting: Family Medicine

## 2018-01-30 DIAGNOSIS — I824Y2 Acute embolism and thrombosis of unspecified deep veins of left proximal lower extremity: Secondary | ICD-10-CM

## 2018-02-07 ENCOUNTER — Encounter: Payer: Self-pay | Admitting: Vascular Surgery

## 2018-02-14 ENCOUNTER — Encounter: Payer: Self-pay | Admitting: Vascular Surgery

## 2018-02-14 ENCOUNTER — Other Ambulatory Visit: Payer: Self-pay

## 2018-02-14 ENCOUNTER — Ambulatory Visit: Payer: Managed Care, Other (non HMO) | Admitting: Vascular Surgery

## 2018-02-14 VITALS — BP 129/84 | HR 73 | Temp 97.6°F | Resp 18 | Ht 71.0 in | Wt 202.0 lb

## 2018-02-14 DIAGNOSIS — I82402 Acute embolism and thrombosis of unspecified deep veins of left lower extremity: Secondary | ICD-10-CM

## 2018-02-14 NOTE — Progress Notes (Signed)
Referring Physician: Dr Ernie Hew  Patient name: Caleb Barajas MRN: [id number] DOB: 1951-03-14 Sex: male  REASON FOR CONSULT: Left leg DVT  HPI: Caleb Barajas is a 67 y.o. male who noticed increasing swelling in his left leg about 5 months ago.  In May 2019 he had a DVT ultrasound performed at [facility].  The images or report are not available for review today.  The patient was placed on Xarelto at that time.  He has now been on this continuously for 5 months.  He states the swelling has improved about 50% but is still persistent in the left leg.  He is wearing some compression stockings.  He denies prior history of DVT.  He does have a history of prostate cancer but currently is cancer free.  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  He intermittently continues to have long track drives with his job currently.  Other medical problems include hyperlipidemia and arthritis both of which have been stable.  He currently does smoke 1 pack of cigarettes per day.  Past Medical History:  Diagnosis Date  . Acute embolism and thrombosis of other specified deep vein of unspecified lower extremity ([place])   . Asthma    as a child  . Barrett esophagus   . Cellulitis   . DVT (deep venous thrombosis) (HCC)    Occlusive R DVT.   Marland Kitchen GERD (gastroesophageal reflux disease)   . History of BPH   . Hyperlipidemia   . Hypogonadism male   . Impotence of organic origin   . Impotence of organic origin   . OSA (obstructive sleep apnea)    states not using at this time  . Prostate cancer ([place])    radioactive seedfs   . Rheumatoid aortitis   . Rheumatoid arthritis(714.0)   . Unspecified asthma(493.90)    Past Surgical History:  Procedure Laterality Date  . CYSTOSCOPY WITH URETHRAL DILATATION N/A 03/15/2016   Procedure: CYSTOSCOPY WITH URETHRAL DILATATION OF STRICTURE;  Surgeon: Irine Seal, MD;  Location: [facility];  Service: Urology;  Laterality: N/A;  . INGUINAL  HERNIA REPAIR Left 1960  . PROSTATE BIOPSY    . RADIOACTIVE SEED IMPLANT N/A 01/03/2013   Procedure: RADIOACTIVE SEED IMPLANT;  Surgeon: Malka So, MD;  Location: [facility];  Service: Urology;  Laterality: N/A;    Family History  Problem Relation Age of Onset  . Cancer Mother   . Emphysema Father   . Cancer Father        prostate    SOCIAL HISTORY: Social History   Socioeconomic History  . Marital status: Married    Spouse name: Not on file  . Number of children: Not on file  . Years of education: Not on file  . Highest education level: Not on file  Occupational History  . Not on file  Social Needs  . Financial resource strain: Not on file  . Food insecurity:    Worry: Not on file    Inability: Not on file  . Transportation needs:    Medical: Not on file    Non-medical: Not on file  Tobacco Use  . Smoking status: Current Every Day Smoker    Packs/day: 1.00    Years: 35.00    Pack years: 35.00    Types: Cigarettes    Last attempt to quit: 05/09/2001    Years since quitting: 16.7  . Smokeless tobacco: Never  Used  . Tobacco comment: Smokes a fair amt in the car and travels alot.  Substance and Sexual Activity  . Alcohol use: Yes    Comment: 2-3 per day  . Drug use: No  . Sexual activity: Not on file  Lifestyle  . Physical activity:    Days per week: Not on file    Minutes per session: Not on file  . Stress: Not on file  Relationships  . Social connections:    Talks on phone: Not on file    Gets together: Not on file    Attends religious service: Not on file    Active member of club or organization: Not on file    Attends meetings of clubs or organizations: Not on file    Relationship status: Not on file  . Intimate partner violence:    Fear of current or ex partner: Not on file    Emotionally abused: Not on file    Physically abused: Not on file    Forced sexual activity: Not on file  Other Topics Concern  . Not on file  Social History  Narrative  . Not on file    No Known Allergies  Current Outpatient Medications  Medication Sig Dispense Refill  . B Complex Vitamins (VITAMIN B COMPLEX PO) Take by mouth.    Marland Kitchen buPROPion (WELLBUTRIN SR) 150 MG 12 hr tablet Take 150 mg by mouth 2 (two) times daily.    . Cholecalciferol (VITAMIN D PO) Take 1 tablet by mouth daily.    . fish oil-omega-3 fatty acids 1000 MG capsule Take 2 g by mouth daily.    . hydroxychloroquine (PLAQUENIL) 200 MG tablet Take 200 mg by mouth 2 (two) times daily.     Marland Kitchen leucovorin (WELLCOVORIN) 5 MG tablet Take by mouth every 6 (six) hours.    . methotrexate (RHEUMATREX) 2.5 MG tablet Take 25 mg by mouth once a week. Caution:Chemotherapy. Protect from light.  Takes 10 tablets on Thursdays    . omeprazole (PRILOSEC) 20 MG capsule Take 20 mg by mouth daily.    . pantoprazole (PROTONIX) 40 MG tablet Take 40 mg by mouth daily.    Marland Kitchen pyridOXINE (VITAMIN B-6) 100 MG tablet Take 100 mg by mouth daily.    . rivaroxaban (XARELTO) 20 MG TABS tablet Take 20 mg by mouth daily with supper.    . sulfaSALAzine (AZULFIDINE) 500 MG tablet Take 1,000 mg by mouth 2 (two) times daily.     . tamsulosin (FLOMAX) 0.4 MG CAPS capsule Take 1 capsule (0.4 mg total) by mouth daily. (Patient taking differently: Take 0.8 mg by mouth daily. ) 30 capsule 1  . folic acid (FOLVITE) 1 MG tablet Take 3 mg by mouth daily.     . sertraline (ZOLOFT) 50 MG tablet Take 50 mg by mouth daily.    . Testosterone (ANDROGEL) 20.25 MG/1.25GM (1.62%) GEL Place 4 application onto the skin daily.     . traMADol (ULTRAM) 50 MG tablet Take 1 tablet (50 mg total) by mouth every 6 (six) hours as needed. (Patient not taking: Reported on 02/14/2018) 10 tablet 0   No current facility-administered medications for this visit.     ROS:   General:  No weight loss, Fever, chills  HEENT: No recent headaches, no nasal bleeding, no visual changes, no sore throat  Neurologic: No dizziness, blackouts, seizures. No recent  symptoms of stroke or mini- stroke. No recent episodes of slurred speech, or temporary blindness.  Cardiac: No recent  episodes of chest pain/pressure, no shortness of breath at rest.  No shortness of breath with exertion.  Denies history of atrial fibrillation or irregular heartbeat  Vascular: No history of rest pain in feet.  No history of claudication.  No history of non-healing ulcer, No history of DVT   Pulmonary: No home oxygen, no productive cough, no hemoptysis,  No asthma or wheezing  Musculoskeletal:  [ ]  Arthritis, [ ]  Low back pain,  [ ]  Joint pain  Hematologic:No history of hypercoagulable state.  No history of easy bleeding.  No history of anemia  Gastrointestinal: No hematochezia or melena,  No gastroesophageal reflux, no trouble swallowing  Urinary: [ ]  chronic Kidney disease, [ ]  on HD - [ ]  MWF or [ ]  TTHS, [ ]  Burning with urination, [ ]  Frequent urination, [ ]  Difficulty urinating;   Skin: No rashes  Psychological: No history of anxiety,  No history of depression   Physical Examination  Vitals:   02/14/18 1534  BP: 129/84  Pulse: 73  Resp: 18  Temp: 97.6 F (36.4 C)  TempSrc: Oral  SpO2: 100%  Weight: 202 lb (91.6 kg)  Height: 5\' 11"  (1.803 m)    Body mass index is 28.17 kg/m.  General:  Alert and oriented, no acute distress HEENT: Normal Neck: No bruit or JVD Pulmonary: Clear to auscultation bilaterally Cardiac: Regular Rate and Rhythm without murmur Abdomen: Soft, non-tender, non-distended, no mass Skin: No rash, hemosiderin staining gaiter area bilaterally Extremity Pulses:  2+ radial, brachial, femoral, absent dorsalis pedis, plus posterior tibial pulses bilaterally Musculoskeletal: No deformity  Edema left leg from the down to the foot approximately 20% larger than the right leg  Neurologic: Upper and lower extremity motor 5/5 and symmetric  DATA:  I reviewed the patient's duplex ultrasound from [facility] imaging dated January 30, 2018.   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  I am not sure of the discordance of this report.  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  He is DVT has been sufficiently treated with 5 months of Xarelto therapy.  Usually 3 months of therapy are sufficient for uncomplicated DVT.  Patient does not appear to have other ongoing risk factors for DVT such as active cancer.  He has never had a prior DVT.  I did discuss with him today that on his truck drives he should be taking an aspirin.  Also I told him to get out of the vehicle once every 45 minutes to an hour walk around the vehicle to have his calf pump move blood through his veins.  No intervention necessary at this point.  Interventions for DVT are usually only beneficial if they are done within the first few weeks of the acute onset of the DVT.   PLAN: The patient will stop his Xarelto today.  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  He will continue to wear his compression stockings.  If he has continued swelling he will call me back in 3 months time at that point we would do a reflux study of his left lower extremity to see if his superficial system has reflux within it as well as the deep system.  If he has superficial venous reflux in the deep system is completely recanalized we could consider a laser ablation for at least improvement  of his swelling symptoms at that point.  If his swelling totally resolves he will follow-up on an as-needed basis.   Ruta Hinds, MD [facility] Office: [phone] Pager: [phone]

## 2018-03-01 ENCOUNTER — Encounter: Payer: Self-pay | Admitting: Rheumatology

## 2019-02-25 ENCOUNTER — Other Ambulatory Visit: Payer: Self-pay | Admitting: *Deleted

## 2019-02-25 DIAGNOSIS — Z122 Encounter for screening for malignant neoplasm of respiratory organs: Secondary | ICD-10-CM

## 2019-02-25 DIAGNOSIS — Z87891 Personal history of nicotine dependence: Secondary | ICD-10-CM

## 2019-02-25 DIAGNOSIS — F17210 Nicotine dependence, cigarettes, uncomplicated: Secondary | ICD-10-CM

## 2019-03-26 ENCOUNTER — Ambulatory Visit (INDEPENDENT_AMBULATORY_CARE_PROVIDER_SITE_OTHER)
Admission: RE | Admit: 2019-03-26 | Discharge: 2019-03-26 | Disposition: A | Discharge status: Home / Self Care | Payer: Managed Care, Other (non HMO) | Source: Ambulatory Visit | Attending: Acute Care | Admitting: Acute Care

## 2019-03-26 ENCOUNTER — Other Ambulatory Visit: Payer: Self-pay

## 2019-03-26 DIAGNOSIS — F17210 Nicotine dependence, cigarettes, uncomplicated: Secondary | ICD-10-CM

## 2019-03-26 DIAGNOSIS — Z87891 Personal history of nicotine dependence: Secondary | ICD-10-CM | POA: Diagnosis not present

## 2019-03-26 DIAGNOSIS — Z122 Encounter for screening for malignant neoplasm of respiratory organs: Secondary | ICD-10-CM

## 2019-04-09 ENCOUNTER — Other Ambulatory Visit: Payer: Self-pay | Admitting: *Deleted

## 2019-04-09 DIAGNOSIS — Z122 Encounter for screening for malignant neoplasm of respiratory organs: Secondary | ICD-10-CM

## 2019-04-09 DIAGNOSIS — Z87891 Personal history of nicotine dependence: Secondary | ICD-10-CM

## 2019-04-09 DIAGNOSIS — F17210 Nicotine dependence, cigarettes, uncomplicated: Secondary | ICD-10-CM

## 2020-04-07 ENCOUNTER — Ambulatory Visit (INDEPENDENT_AMBULATORY_CARE_PROVIDER_SITE_OTHER)
Admission: RE | Admit: 2020-04-07 | Discharge: 2020-04-07 | Disposition: A | Discharge status: Home / Self Care | Payer: Managed Care, Other (non HMO) | Source: Ambulatory Visit | Attending: Acute Care | Admitting: Acute Care

## 2020-04-07 ENCOUNTER — Other Ambulatory Visit: Payer: Self-pay

## 2020-04-07 DIAGNOSIS — Z87891 Personal history of nicotine dependence: Secondary | ICD-10-CM

## 2020-04-07 DIAGNOSIS — Z122 Encounter for screening for malignant neoplasm of respiratory organs: Secondary | ICD-10-CM

## 2020-04-07 DIAGNOSIS — F17210 Nicotine dependence, cigarettes, uncomplicated: Secondary | ICD-10-CM

## 2020-04-13 ENCOUNTER — Other Ambulatory Visit: Payer: Self-pay | Admitting: *Deleted

## 2020-04-13 DIAGNOSIS — Z87891 Personal history of nicotine dependence: Secondary | ICD-10-CM

## 2020-04-13 DIAGNOSIS — F17210 Nicotine dependence, cigarettes, uncomplicated: Secondary | ICD-10-CM

## 2020-04-13 NOTE — Progress Notes (Signed)

## 2020-12-24 DIAGNOSIS — Z8546 Personal history of malignant neoplasm of prostate: Secondary | ICD-10-CM | POA: Diagnosis not present

## 2020-12-24 DIAGNOSIS — E291 Testicular hypofunction: Secondary | ICD-10-CM | POA: Diagnosis not present

## 2021-02-02 DIAGNOSIS — E291 Testicular hypofunction: Secondary | ICD-10-CM | POA: Diagnosis not present

## 2021-02-23 DIAGNOSIS — K219 Gastro-esophageal reflux disease without esophagitis: Secondary | ICD-10-CM | POA: Diagnosis not present

## 2021-02-23 DIAGNOSIS — I152 Hypertension secondary to endocrine disorders: Secondary | ICD-10-CM | POA: Diagnosis not present

## 2021-02-23 DIAGNOSIS — E1165 Type 2 diabetes mellitus with hyperglycemia: Secondary | ICD-10-CM | POA: Diagnosis not present

## 2021-02-23 DIAGNOSIS — R739 Hyperglycemia, unspecified: Secondary | ICD-10-CM | POA: Diagnosis not present

## 2021-04-15 DIAGNOSIS — E782 Mixed hyperlipidemia: Secondary | ICD-10-CM | POA: Diagnosis not present

## 2021-04-15 DIAGNOSIS — K760 Fatty (change of) liver, not elsewhere classified: Secondary | ICD-10-CM | POA: Diagnosis not present

## 2021-04-15 DIAGNOSIS — Z125 Encounter for screening for malignant neoplasm of prostate: Secondary | ICD-10-CM | POA: Diagnosis not present

## 2021-04-15 DIAGNOSIS — E559 Vitamin D deficiency, unspecified: Secondary | ICD-10-CM | POA: Diagnosis not present

## 2021-04-22 ENCOUNTER — Other Ambulatory Visit: Payer: Self-pay | Admitting: Family Medicine

## 2021-04-22 DIAGNOSIS — Z23 Encounter for immunization: Secondary | ICD-10-CM | POA: Diagnosis not present

## 2021-04-22 DIAGNOSIS — Z0000 Encounter for general adult medical examination without abnormal findings: Secondary | ICD-10-CM | POA: Diagnosis not present

## 2021-04-22 DIAGNOSIS — J982 Interstitial emphysema: Secondary | ICD-10-CM

## 2021-04-22 DIAGNOSIS — R0602 Shortness of breath: Secondary | ICD-10-CM

## 2021-04-22 DIAGNOSIS — Z720 Tobacco use: Secondary | ICD-10-CM

## 2021-04-22 DIAGNOSIS — Z1211 Encounter for screening for malignant neoplasm of colon: Secondary | ICD-10-CM | POA: Diagnosis not present

## 2021-04-22 DIAGNOSIS — J449 Chronic obstructive pulmonary disease, unspecified: Secondary | ICD-10-CM

## 2021-04-28 ENCOUNTER — Ambulatory Visit
Admission: RE | Admit: 2021-04-28 | Discharge: 2021-04-28 | Disposition: A | Discharge status: Home / Self Care | Payer: Medicare Other | Source: Ambulatory Visit | Attending: Family Medicine | Admitting: Family Medicine

## 2021-04-28 DIAGNOSIS — Z136 Encounter for screening for cardiovascular disorders: Secondary | ICD-10-CM | POA: Diagnosis not present

## 2021-04-28 DIAGNOSIS — R0602 Shortness of breath: Secondary | ICD-10-CM

## 2021-04-28 DIAGNOSIS — J449 Chronic obstructive pulmonary disease, unspecified: Secondary | ICD-10-CM

## 2021-04-28 DIAGNOSIS — J982 Interstitial emphysema: Secondary | ICD-10-CM

## 2021-04-28 DIAGNOSIS — Z720 Tobacco use: Secondary | ICD-10-CM

## 2021-04-28 DIAGNOSIS — Z87891 Personal history of nicotine dependence: Secondary | ICD-10-CM | POA: Diagnosis not present

## 2021-05-18 ENCOUNTER — Ambulatory Visit: Payer: Medicare Other

## 2021-05-18 ENCOUNTER — Ambulatory Visit
Admission: RE | Admit: 2021-05-18 | Discharge: 2021-05-18 | Disposition: A | Discharge status: Home / Self Care | Payer: Medicare Other | Source: Ambulatory Visit | Attending: Family Medicine | Admitting: Family Medicine

## 2021-05-18 DIAGNOSIS — I2510 Atherosclerotic heart disease of native coronary artery without angina pectoris: Secondary | ICD-10-CM | POA: Diagnosis not present

## 2021-05-18 DIAGNOSIS — Z8546 Personal history of malignant neoplasm of prostate: Secondary | ICD-10-CM | POA: Diagnosis not present

## 2021-05-18 DIAGNOSIS — J432 Centrilobular emphysema: Secondary | ICD-10-CM | POA: Diagnosis not present

## 2021-05-18 DIAGNOSIS — Z720 Tobacco use: Secondary | ICD-10-CM

## 2021-05-18 DIAGNOSIS — F17210 Nicotine dependence, cigarettes, uncomplicated: Secondary | ICD-10-CM | POA: Diagnosis not present

## 2021-05-19 ENCOUNTER — Other Ambulatory Visit: Payer: Self-pay | Admitting: Family Medicine

## 2021-05-19 DIAGNOSIS — R9389 Abnormal findings on diagnostic imaging of other specified body structures: Secondary | ICD-10-CM

## 2021-05-28 DIAGNOSIS — Z8546 Personal history of malignant neoplasm of prostate: Secondary | ICD-10-CM | POA: Diagnosis not present

## 2021-06-01 ENCOUNTER — Ambulatory Visit
Admission: RE | Admit: 2021-06-01 | Discharge: 2021-06-01 | Disposition: A | Discharge status: Home / Self Care | Payer: No Typology Code available for payment source | Source: Ambulatory Visit | Attending: Family Medicine | Admitting: Family Medicine

## 2021-06-01 DIAGNOSIS — R9389 Abnormal findings on diagnostic imaging of other specified body structures: Secondary | ICD-10-CM

## 2021-06-01 DIAGNOSIS — R351 Nocturia: Secondary | ICD-10-CM | POA: Diagnosis not present

## 2021-06-01 DIAGNOSIS — E291 Testicular hypofunction: Secondary | ICD-10-CM | POA: Diagnosis not present

## 2021-06-01 DIAGNOSIS — Z8546 Personal history of malignant neoplasm of prostate: Secondary | ICD-10-CM | POA: Diagnosis not present

## 2021-06-01 DIAGNOSIS — N401 Enlarged prostate with lower urinary tract symptoms: Secondary | ICD-10-CM | POA: Diagnosis not present

## 2021-07-20 ENCOUNTER — Other Ambulatory Visit: Payer: Self-pay | Admitting: *Deleted

## 2021-07-20 DIAGNOSIS — Z87891 Personal history of nicotine dependence: Secondary | ICD-10-CM

## 2021-07-20 DIAGNOSIS — E559 Vitamin D deficiency, unspecified: Secondary | ICD-10-CM | POA: Diagnosis not present

## 2021-07-20 DIAGNOSIS — E782 Mixed hyperlipidemia: Secondary | ICD-10-CM | POA: Diagnosis not present

## 2021-07-20 DIAGNOSIS — F17210 Nicotine dependence, cigarettes, uncomplicated: Secondary | ICD-10-CM

## 2021-07-20 DIAGNOSIS — R739 Hyperglycemia, unspecified: Secondary | ICD-10-CM | POA: Diagnosis not present

## 2021-07-22 DIAGNOSIS — E559 Vitamin D deficiency, unspecified: Secondary | ICD-10-CM | POA: Diagnosis not present

## 2021-07-22 DIAGNOSIS — E782 Mixed hyperlipidemia: Secondary | ICD-10-CM | POA: Diagnosis not present

## 2021-07-22 DIAGNOSIS — K219 Gastro-esophageal reflux disease without esophagitis: Secondary | ICD-10-CM | POA: Diagnosis not present

## 2021-07-22 DIAGNOSIS — E1165 Type 2 diabetes mellitus with hyperglycemia: Secondary | ICD-10-CM | POA: Diagnosis not present

## 2021-11-15 DIAGNOSIS — E782 Mixed hyperlipidemia: Secondary | ICD-10-CM | POA: Diagnosis not present

## 2021-11-15 DIAGNOSIS — E119 Type 2 diabetes mellitus without complications: Secondary | ICD-10-CM | POA: Diagnosis not present

## 2021-11-15 DIAGNOSIS — E559 Vitamin D deficiency, unspecified: Secondary | ICD-10-CM | POA: Diagnosis not present

## 2021-11-18 ENCOUNTER — Ambulatory Visit
Admission: RE | Admit: 2021-11-18 | Discharge: 2021-11-18 | Disposition: A | Discharge status: Home / Self Care | Payer: Medicare Other | Source: Ambulatory Visit | Attending: Family Medicine | Admitting: Family Medicine

## 2021-11-18 ENCOUNTER — Other Ambulatory Visit: Payer: Self-pay | Admitting: Family Medicine

## 2021-11-18 DIAGNOSIS — M170 Bilateral primary osteoarthritis of knee: Secondary | ICD-10-CM

## 2021-11-18 DIAGNOSIS — E782 Mixed hyperlipidemia: Secondary | ICD-10-CM | POA: Diagnosis not present

## 2021-11-18 DIAGNOSIS — E1121 Type 2 diabetes mellitus with diabetic nephropathy: Secondary | ICD-10-CM | POA: Diagnosis not present

## 2021-11-18 DIAGNOSIS — M25561 Pain in right knee: Secondary | ICD-10-CM

## 2021-11-18 DIAGNOSIS — E559 Vitamin D deficiency, unspecified: Secondary | ICD-10-CM | POA: Diagnosis not present

## 2021-11-18 DIAGNOSIS — E1165 Type 2 diabetes mellitus with hyperglycemia: Secondary | ICD-10-CM | POA: Diagnosis not present

## 2021-11-18 DIAGNOSIS — M25562 Pain in left knee: Secondary | ICD-10-CM

## 2021-11-24 DIAGNOSIS — Z8546 Personal history of malignant neoplasm of prostate: Secondary | ICD-10-CM | POA: Diagnosis not present

## 2021-11-24 DIAGNOSIS — E291 Testicular hypofunction: Secondary | ICD-10-CM | POA: Diagnosis not present

## 2021-12-01 DIAGNOSIS — N401 Enlarged prostate with lower urinary tract symptoms: Secondary | ICD-10-CM | POA: Diagnosis not present

## 2021-12-01 DIAGNOSIS — R351 Nocturia: Secondary | ICD-10-CM | POA: Diagnosis not present

## 2021-12-01 DIAGNOSIS — E291 Testicular hypofunction: Secondary | ICD-10-CM | POA: Diagnosis not present

## 2021-12-28 DIAGNOSIS — E291 Testicular hypofunction: Secondary | ICD-10-CM | POA: Diagnosis not present

## 2022-02-11 DIAGNOSIS — E1165 Type 2 diabetes mellitus with hyperglycemia: Secondary | ICD-10-CM | POA: Diagnosis not present

## 2022-02-16 DIAGNOSIS — E1122 Type 2 diabetes mellitus with diabetic chronic kidney disease: Secondary | ICD-10-CM | POA: Diagnosis not present

## 2022-02-16 DIAGNOSIS — I10 Essential (primary) hypertension: Secondary | ICD-10-CM | POA: Diagnosis not present

## 2022-02-16 DIAGNOSIS — E1165 Type 2 diabetes mellitus with hyperglycemia: Secondary | ICD-10-CM | POA: Diagnosis not present

## 2022-02-28 DIAGNOSIS — Z1339 Encounter for screening examination for other mental health and behavioral disorders: Secondary | ICD-10-CM | POA: Diagnosis not present

## 2022-02-28 DIAGNOSIS — Z0000 Encounter for general adult medical examination without abnormal findings: Secondary | ICD-10-CM | POA: Diagnosis not present

## 2022-02-28 DIAGNOSIS — Z1331 Encounter for screening for depression: Secondary | ICD-10-CM | POA: Diagnosis not present

## 2022-03-14 IMAGING — CT CT CARDIAC CORONARY ARTERY CALCIUM SCORE
3 series · 14 of 20 positions shown, 16 images · non-contrast
Comparison: Prior low-dose screening chest CT exams with the most
recent dated 05/18/2021.

CLINICAL DATA: 70-year-old Caucasian male with history of
hyperlipidemia and smoking history.

EXAM:
CT CARDIAC CORONARY ARTERY CALCIUM SCORE
TECHNIQUE: Non-contrast imaging through the heart was performed using
prospective ECG gating. Image post processing was performed on an
independent workstation, allowing for quantitative analysis of the
heart and coronary arteries. Note that this exam targets the heart
and the chest was not imaged in its entirety.

[Series 2: calcium scoring 2.00 qr36 bestdiast 70% hrt calciu · axial · 0.40mm/px · z∈[+1618,+1698]mm · 4 of 68 slices shown]
[im 14/68  vessel]
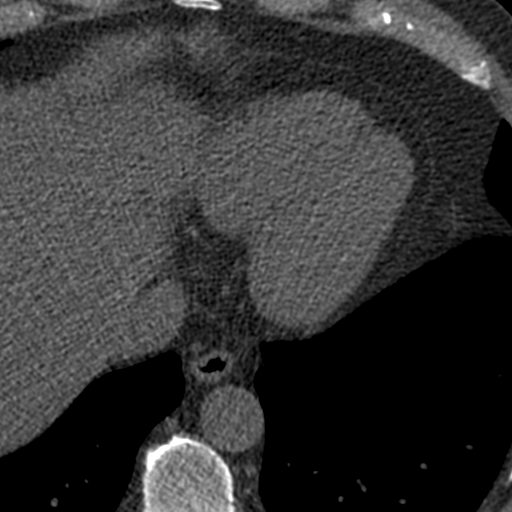
[im 27/68  vessel]
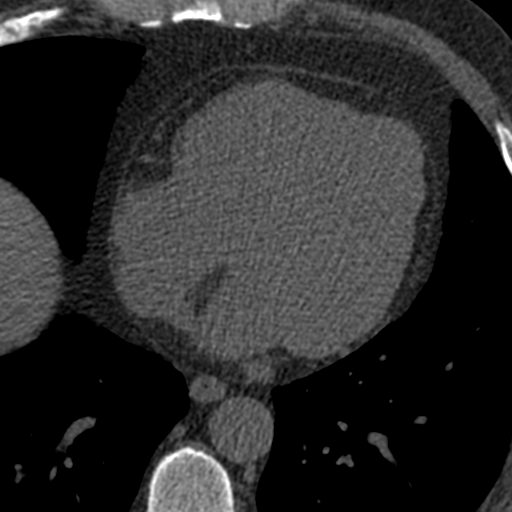
[im 41/68  vessel]
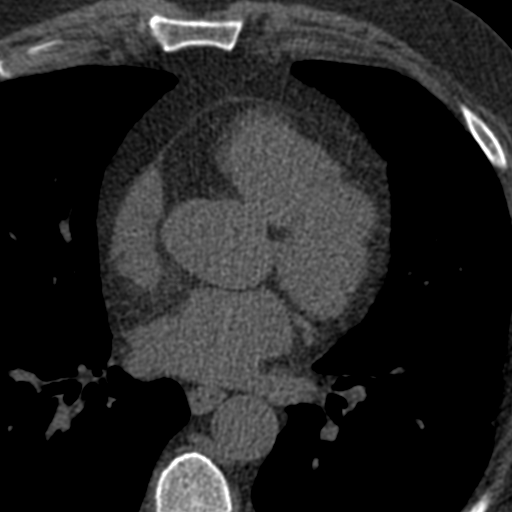
[im 54/68  vessel]
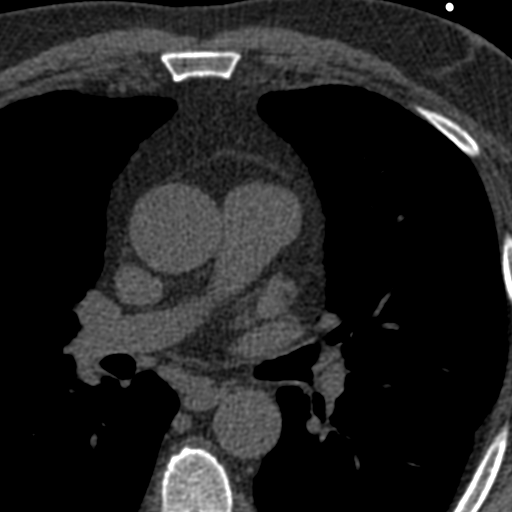

[Series 3: calcium scoring 2.00 br40 bestdiast 70% axial · axial · 0.64mm/px · z∈[+1613,+1705]mm · 5 of 70 slices shown, 7 images]
[im 12/70  vessel]
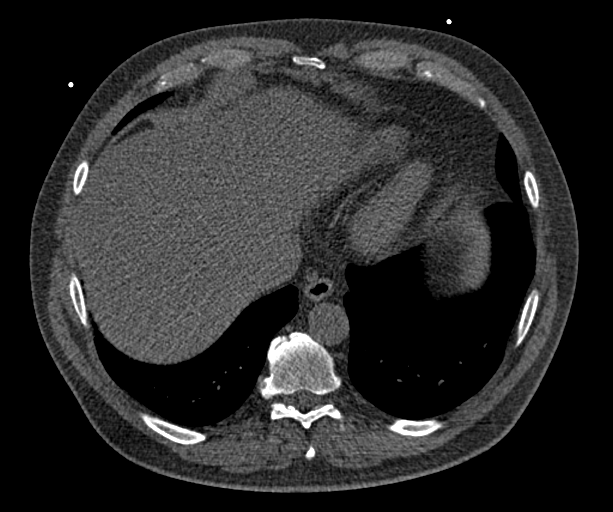
[im 12/70  lung]
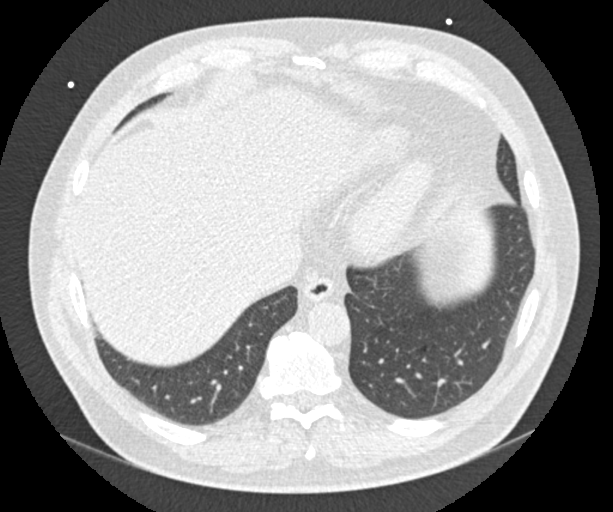
[im 24/70  vessel]
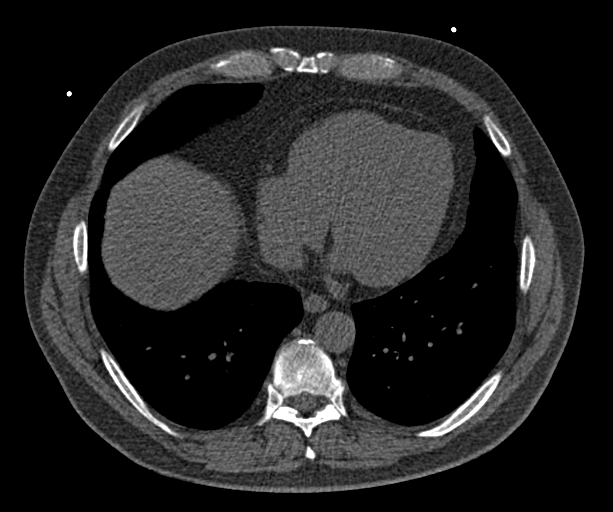
[im 35/70  vessel]
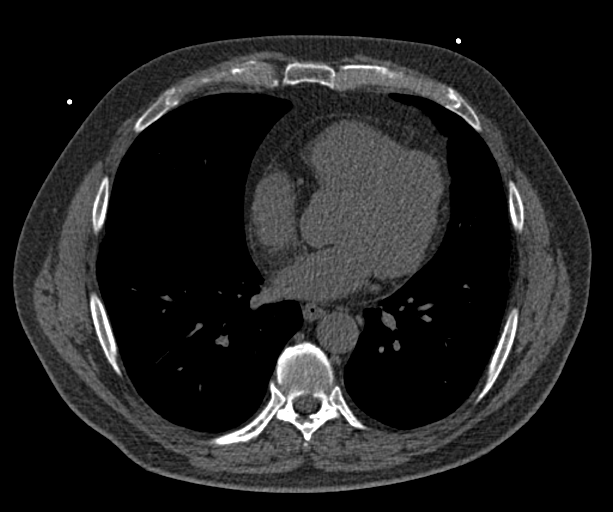
[im 47/70  vessel]
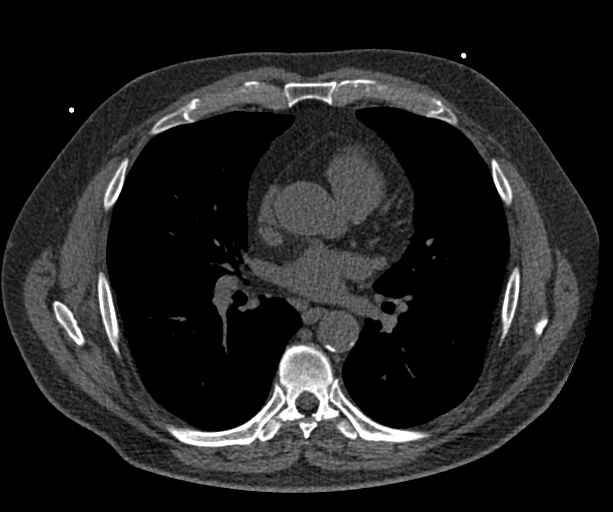
[im 58/70  vessel]
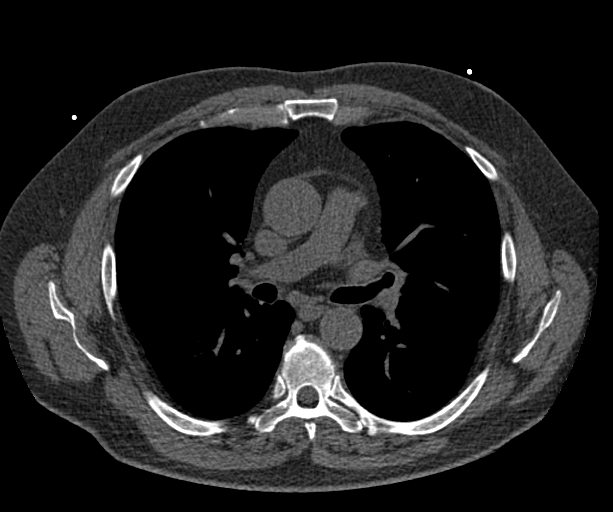
[im 58/70  lung]
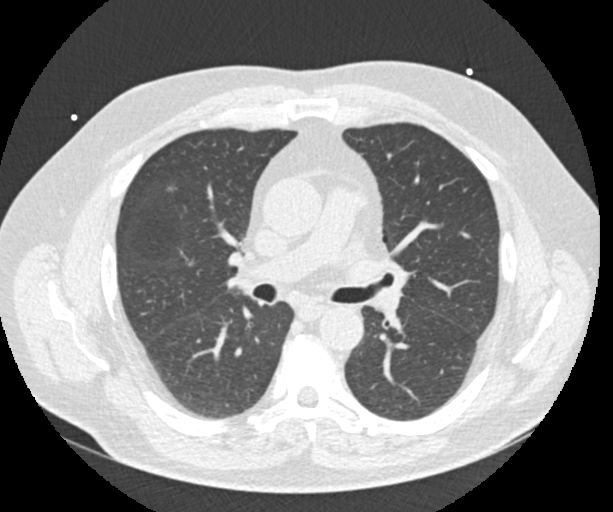

[Series 9: calcium scoring 2.00 br60 bestdiast 70% lungs · axial · 0.64mm/px · z∈[+1613,+1705]mm · 5 of 70 slices shown]
[im 12/70  vessel]
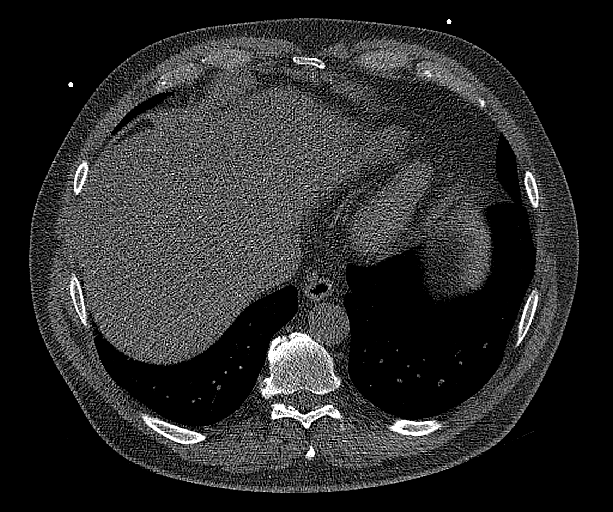
[im 24/70  vessel]
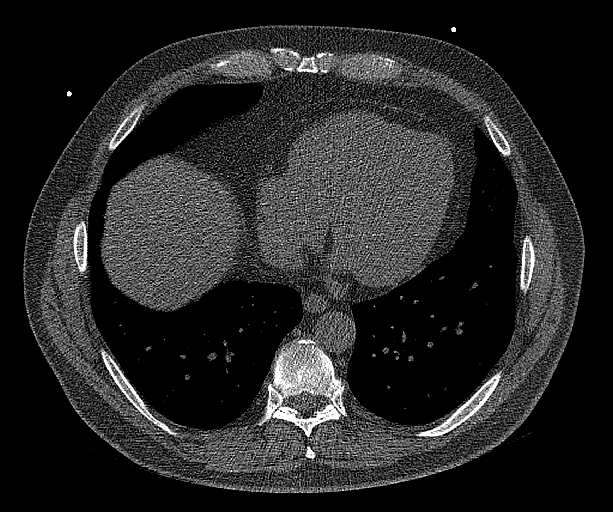
[im 35/70  vessel]
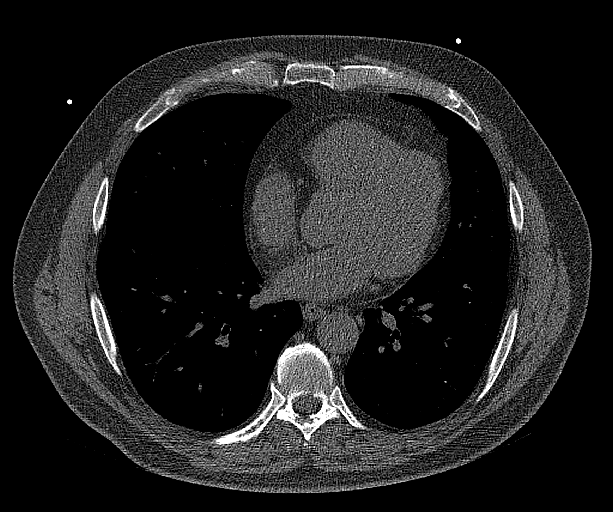
[im 47/70  vessel]
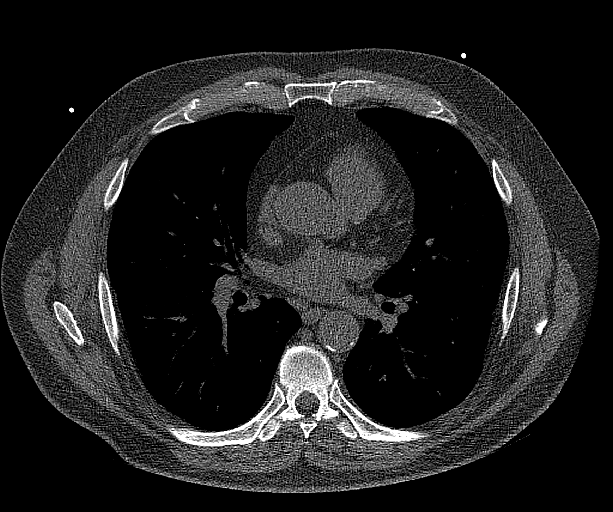
[im 58/70  vessel]
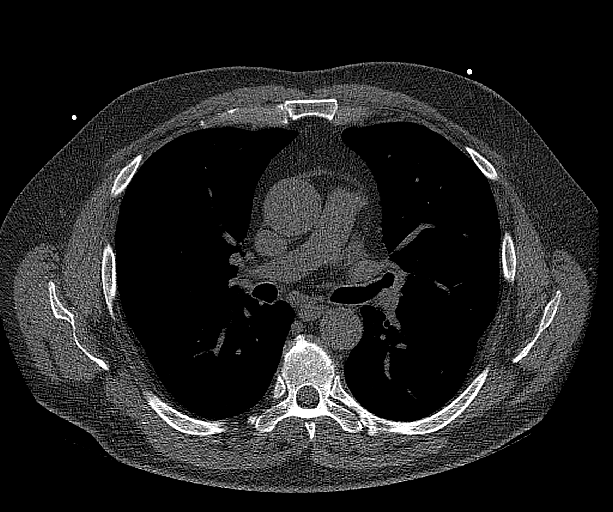

[14 of 20 positions shown; findings below may reference images not displayed]

FINDINGS: CORONARY CALCIUM SCORES:

Left Main: 0

LAD:

LCx: 0

RCA: 0

Total Agatston Score:

[HOSPITAL] percentile: 20

AORTA MEASUREMENTS:

Ascending Aorta: 34 mm

Descending Aorta: 26 mm

OTHER FINDINGS:

The heart size is within normal limits. No pericardial fluid is
identified. There is atherosclerosis of the descending thoracic
aorta. Visualized segments of the thoracic aorta and central
pulmonary arteries are normal in caliber. Visualized mediastinum and
hilar regions demonstrate stable scattered small lymph nodes in the
mediastinum. Visualized lungs show no evidence of pulmonary edema,
consolidation, pneumothorax, nodule or pleural fluid. Visualized
liver demonstrates evidence of stable steatosis. Visualized bony
structures are unremarkable.
IMPRESSION: 1. Coronary calcium score of 4.0 is at the 20th percentile for the
patient's age, sex and race.
2. Stable small scattered lymph nodes in the mediastinum.
3. Stable hepatic steatosis.

## 2022-03-28 DIAGNOSIS — I10 Essential (primary) hypertension: Secondary | ICD-10-CM | POA: Diagnosis not present

## 2022-04-14 DIAGNOSIS — I10 Essential (primary) hypertension: Secondary | ICD-10-CM | POA: Diagnosis not present

## 2022-04-22 DIAGNOSIS — E119 Type 2 diabetes mellitus without complications: Secondary | ICD-10-CM | POA: Diagnosis not present

## 2022-04-22 DIAGNOSIS — Z0000 Encounter for general adult medical examination without abnormal findings: Secondary | ICD-10-CM | POA: Diagnosis not present

## 2022-04-22 DIAGNOSIS — I10 Essential (primary) hypertension: Secondary | ICD-10-CM | POA: Diagnosis not present

## 2022-04-22 DIAGNOSIS — Z114 Encounter for screening for human immunodeficiency virus [HIV]: Secondary | ICD-10-CM | POA: Diagnosis not present

## 2022-04-22 DIAGNOSIS — Z125 Encounter for screening for malignant neoplasm of prostate: Secondary | ICD-10-CM | POA: Diagnosis not present

## 2022-04-26 DIAGNOSIS — I10 Essential (primary) hypertension: Secondary | ICD-10-CM | POA: Diagnosis not present

## 2022-04-26 DIAGNOSIS — Z1211 Encounter for screening for malignant neoplasm of colon: Secondary | ICD-10-CM | POA: Diagnosis not present

## 2022-04-26 DIAGNOSIS — Z0000 Encounter for general adult medical examination without abnormal findings: Secondary | ICD-10-CM | POA: Diagnosis not present

## 2022-04-26 DIAGNOSIS — E1165 Type 2 diabetes mellitus with hyperglycemia: Secondary | ICD-10-CM | POA: Diagnosis not present

## 2022-05-18 ENCOUNTER — Ambulatory Visit
Admission: RE | Admit: 2022-05-18 | Discharge: 2022-05-18 | Disposition: A | Discharge status: Home / Self Care | Payer: BLUE CROSS/BLUE SHIELD | Source: Ambulatory Visit | Attending: Family Medicine | Admitting: Family Medicine

## 2022-05-18 DIAGNOSIS — F17210 Nicotine dependence, cigarettes, uncomplicated: Secondary | ICD-10-CM

## 2022-05-18 DIAGNOSIS — Z87891 Personal history of nicotine dependence: Secondary | ICD-10-CM

## 2022-05-20 ENCOUNTER — Other Ambulatory Visit: Payer: Self-pay | Admitting: Acute Care

## 2022-05-20 DIAGNOSIS — Z8546 Personal history of malignant neoplasm of prostate: Secondary | ICD-10-CM | POA: Diagnosis not present

## 2022-05-20 DIAGNOSIS — Z122 Encounter for screening for malignant neoplasm of respiratory organs: Secondary | ICD-10-CM

## 2022-05-20 DIAGNOSIS — E291 Testicular hypofunction: Secondary | ICD-10-CM | POA: Diagnosis not present

## 2022-05-20 DIAGNOSIS — F17210 Nicotine dependence, cigarettes, uncomplicated: Secondary | ICD-10-CM

## 2022-05-20 DIAGNOSIS — Z87891 Personal history of nicotine dependence: Secondary | ICD-10-CM

## 2022-05-23 DIAGNOSIS — M0600 Rheumatoid arthritis without rheumatoid factor, unspecified site: Secondary | ICD-10-CM | POA: Diagnosis not present

## 2022-05-23 DIAGNOSIS — Z1211 Encounter for screening for malignant neoplasm of colon: Secondary | ICD-10-CM | POA: Diagnosis not present

## 2022-05-23 DIAGNOSIS — K219 Gastro-esophageal reflux disease without esophagitis: Secondary | ICD-10-CM | POA: Diagnosis not present

## 2022-05-27 DIAGNOSIS — Z8546 Personal history of malignant neoplasm of prostate: Secondary | ICD-10-CM | POA: Diagnosis not present

## 2022-05-27 DIAGNOSIS — N401 Enlarged prostate with lower urinary tract symptoms: Secondary | ICD-10-CM | POA: Diagnosis not present

## 2022-05-27 DIAGNOSIS — E291 Testicular hypofunction: Secondary | ICD-10-CM | POA: Diagnosis not present

## 2022-05-27 DIAGNOSIS — R3912 Poor urinary stream: Secondary | ICD-10-CM | POA: Diagnosis not present

## 2022-05-31 DIAGNOSIS — K080 Exfoliation of teeth due to systemic causes: Secondary | ICD-10-CM | POA: Diagnosis not present

## 2022-06-07 DIAGNOSIS — I10 Essential (primary) hypertension: Secondary | ICD-10-CM | POA: Diagnosis not present

## 2022-06-07 DIAGNOSIS — J439 Emphysema, unspecified: Secondary | ICD-10-CM | POA: Diagnosis not present

## 2022-06-07 DIAGNOSIS — I2510 Atherosclerotic heart disease of native coronary artery without angina pectoris: Secondary | ICD-10-CM | POA: Diagnosis not present

## 2022-06-07 DIAGNOSIS — J449 Chronic obstructive pulmonary disease, unspecified: Secondary | ICD-10-CM | POA: Diagnosis not present

## 2022-06-21 DIAGNOSIS — K2100 Gastro-esophageal reflux disease with esophagitis, without bleeding: Secondary | ICD-10-CM | POA: Diagnosis not present

## 2022-06-21 DIAGNOSIS — D123 Benign neoplasm of transverse colon: Secondary | ICD-10-CM | POA: Diagnosis not present

## 2022-06-21 DIAGNOSIS — D128 Benign neoplasm of rectum: Secondary | ICD-10-CM | POA: Diagnosis not present

## 2022-06-21 DIAGNOSIS — K635 Polyp of colon: Secondary | ICD-10-CM | POA: Diagnosis not present

## 2022-06-21 DIAGNOSIS — K621 Rectal polyp: Secondary | ICD-10-CM | POA: Diagnosis not present

## 2022-06-21 DIAGNOSIS — R12 Heartburn: Secondary | ICD-10-CM | POA: Diagnosis not present

## 2022-06-21 DIAGNOSIS — Z1211 Encounter for screening for malignant neoplasm of colon: Secondary | ICD-10-CM | POA: Diagnosis not present

## 2022-06-23 ENCOUNTER — Ambulatory Visit: Payer: Medicare Other | Attending: Internal Medicine | Admitting: Internal Medicine

## 2022-06-23 ENCOUNTER — Encounter: Payer: Self-pay | Admitting: Internal Medicine

## 2022-06-23 VITALS — BP 109/70 | HR 76 | Ht 71.0 in | Wt 213.8 lb

## 2022-06-23 DIAGNOSIS — T466X5A Adverse effect of antihyperlipidemic and antiarteriosclerotic drugs, initial encounter: Secondary | ICD-10-CM

## 2022-06-23 DIAGNOSIS — I2510 Atherosclerotic heart disease of native coronary artery without angina pectoris: Secondary | ICD-10-CM | POA: Diagnosis not present

## 2022-06-23 DIAGNOSIS — E782 Mixed hyperlipidemia: Secondary | ICD-10-CM

## 2022-06-23 DIAGNOSIS — Z720 Tobacco use: Secondary | ICD-10-CM

## 2022-06-23 DIAGNOSIS — I2584 Coronary atherosclerosis due to calcified coronary lesion: Secondary | ICD-10-CM | POA: Diagnosis not present

## 2022-06-23 DIAGNOSIS — I10 Essential (primary) hypertension: Secondary | ICD-10-CM

## 2022-06-23 DIAGNOSIS — C61 Malignant neoplasm of prostate: Secondary | ICD-10-CM

## 2022-06-23 DIAGNOSIS — M791 Myalgia, unspecified site: Secondary | ICD-10-CM

## 2022-06-23 DIAGNOSIS — I700 Atherosclerosis of aorta: Secondary | ICD-10-CM

## 2022-06-23 NOTE — Patient Instructions (Signed)
Medication Instructions:  Your physician recommends that you continue on your current medications as directed. Please refer to the Current Medication list given to you today.  *If you need a refill on your cardiac medications before your next appointment, please call your pharmacy*   Lab Work: NONE If you have labs (blood work) drawn today and your tests are completely normal, you will receive your results only by: [place] (if you have MyChart) OR A paper copy in the mail If you have any lab test that is abnormal or we need to change your treatment, we will call you to review the results.   Testing/Procedures: Please ask Primary Care Provider to send results of labs to our office.  Fax number [phone].   Follow-Up: At [facility], you and your health needs are our priority.  As part of our continuing mission to provide you with exceptional heart care, we have created designated Provider Care Teams.  These Care Teams include your primary Cardiologist (physician) and Advanced Practice Providers (APPs -  Physician Assistants and Nurse Practitioners) who all work together to provide you with the care you need, when you need it.    Your next appointment:   1 year(s)  Provider:   Rudean Haskell, MD

## 2022-06-23 NOTE — Progress Notes (Signed)
Cardiology Office Note:    Date:  06/23/2022   ID:  Caleb Barajas, DOB 23-Mar-1951, MRN [id number]  PCP:  Fanny Bien, MD   [facility] Providers Cardiologist:  None     Referring MD: Fanny Bien, MD   CC: Doctor recommended it  Consulted for the evaluation of CAC at the behest of Dr. Ernie Hew   History of Present Illness:    Caleb Barajas is a 72 y.o. male with a hx oHTN and HLD, former smoker, distant hx of DVT.  Had LAD CAC and aortic atherosclerosis.  Patient notes that he is feeling great.   He is retired. Does a few project around his [place] and his house.  Has had no chest pain, chest pressure, chest tightness, chest stinging .   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    No shortness of breath, DOE with activity.  No PND or orthopnea.  No weight gain leg swelling , or abdominal swelling.  No syncope or near syncope . Notes  no palpitations or funny heart beats.     Has recent CT scan for lung cancer screen.   Past Medical History:  Diagnosis Date   Acute embolism and thrombosis of other specified deep vein of unspecified lower extremity ([place])    Asthma    as a child   Barrett esophagus    Cellulitis    DVT (deep venous thrombosis) (HCC)    Occlusive R DVT.    GERD (gastroesophageal reflux disease)    History of BPH    Hyperlipidemia    Hypogonadism male    Impotence of organic origin    Impotence of organic origin    OSA (obstructive sleep apnea)    states not using at this time   Prostate cancer ([place])    radioactive seedfs    Rheumatoid aortitis    Rheumatoid arthritis(714.0)    Unspecified asthma(493.90)     Past Surgical History:  Procedure Laterality Date   CYSTOSCOPY WITH URETHRAL DILATATION N/A 03/15/2016   Procedure: CYSTOSCOPY WITH URETHRAL DILATATION OF STRICTURE;  Surgeon: Irine Seal, MD;  Location: [facility];  Service:  Urology;  Laterality: N/A;   INGUINAL HERNIA REPAIR Left 1960   PROSTATE BIOPSY     RADIOACTIVE SEED IMPLANT N/A 01/03/2013   Procedure: RADIOACTIVE SEED IMPLANT;  Surgeon: Malka So, MD;  [facility];  Service: Urology;  Laterality: N/A;    Current Medications: Current Meds  Medication Sig   B Complex Vitamins (VITAMIN B COMPLEX PO) Take by mouth.   Cholecalciferol (VITAMIN D PO) Take 1 tablet by mouth daily.   famotidine (PEPCID) 40 MG tablet Take 40 mg by mouth daily.   fish oil-omega-3 fatty acids 1000 MG capsule Take 2 g by mouth daily.   pravastatin (PRAVACHOL) 10 MG tablet Take 10 mg by mouth at bedtime.   pyridOXINE (VITAMIN B-6) 100 MG tablet Take 100 mg by mouth daily.   tamsulosin (FLOMAX) 0.4 MG CAPS capsule Take 1 capsule (0.4 mg total) by mouth daily. (Patient taking differently: Take 0.8 mg by mouth daily.)   Testosterone (ANDROGEL) 20.25 MG/1.25GM (1.62%) GEL Place 2 application  onto the skin daily.   valsartan (DIOVAN) 80 MG tablet Take by mouth daily.   [DISCONTINUED] buPROPion (WELLBUTRIN SR) 150 MG 12 hr tablet Take 150 mg by mouth 2 (two) times daily.   [  DISCONTINUED] folic acid (FOLVITE) 1 MG tablet Take 3 mg by mouth daily.    [DISCONTINUED] hydroxychloroquine (PLAQUENIL) 200 MG tablet Take 200 mg by mouth 2 (two) times daily.    [DISCONTINUED] omeprazole (PRILOSEC) 20 MG capsule Take 20 mg by mouth daily.   [DISCONTINUED] pantoprazole (PROTONIX) 40 MG tablet Take 40 mg by mouth daily.     Allergies:   Patient has no known allergies.   Social History   Socioeconomic History   Marital status: Married    Spouse name: Not on file   Number of children: Not on file   Years of education: Not on file   Highest education level: Not on file  Occupational History   Not on file  Tobacco Use   Smoking status: Every Day    Packs/day: 1.00    Years: 35.00    Total pack years: 35.00    Types: Cigarettes    Last attempt to quit: 05/09/2001     Years since quitting: 21.1   Smokeless tobacco: Never   Tobacco comments:    Smokes a fair amt in the car and travels alot.  Substance and Sexual Activity   Alcohol use: Yes    Comment: 2-3 per day   Drug use: No   Sexual activity: Not on file  Other Topics Concern   Not on file  Social History Narrative   Not on file   Social Determinants of Health   Financial Resource Strain: Not on file  Food Insecurity: Not on file  Transportation Needs: Not on file  Physical Activity: Not on file  Stress: Not on file  Social Connections: Not on file     Family History: The patient's family history includes Cancer in his father and mother; Emphysema in his father.  ROS:   Please see the history of present illness.     All other systems reviewed and are negative.  EKGs/Labs/Other Studies Reviewed:    The following studies were reviewed today:  EKG:  EKG is  ordered today.  The ekg ordered today demonstrates  06/23/22: SR rate 76    Recent Labs: No results found for requested labs within last 365 days.  Recent Lipid Panel No results found for: "CHOL", "TRIG", "HDL", "CHOLHDL", "VLDL", "LDLCALC", "LDLDIRECT"   Physical Exam:    VS:  BP 109/70   Pulse 76   Ht 5' 11"$  (1.803 m)   Wt 213 lb 12.8 oz (97 kg)   SpO2 99%   BMI 29.82 kg/m     Wt Readings from Last 3 Encounters:  06/23/22 213 lb 12.8 oz (97 kg)  02/14/18 202 lb (91.6 kg)  03/15/16 209 lb (94.8 kg)     GEN:  Well nourished, well developed in no acute distress HEENT: Frank's sign NECK: No JVD CARDIAC: RRR, no murmurs, rubs, gallops RESPIRATORY:  Clear to auscultation without rales, wheezing or rhonchi  ABDOMEN: Soft, non-tender, non-distended MUSCULOSKELETAL:  No edema; No deformity  SKIN: Warm and dry NEUROLOGIC:  Alert and oriented x 3 PSYCHIATRIC:  Normal affect   ASSESSMENT:    1. Essential hypertension   2. Mixed hyperlipidemia   3. Tobacco abuse   4. Coronary artery calcification   5. Aortic  atherosclerosis ([place])   6. Myalgia due to statin   7. Stage T1c adenocarcinoma of the prostate with Gleason score of 3+3 and a PSA of 5.53 - favorable risk    PLAN:    CAC HLD Aortic atherosclerosis Statin myopathy - in 2023 Coronary  calcium score of 4.0 is at the 20th percentile for the patient's age, sex and race. - reviewed CT with patient - tolerating low dose pravastatin (just started) - stop smoking; we discussed this in the context of cardiac risk and in cost savings - lipids and ALT March 12 (will requrest these from Dr. Ernie Hew) - LDL < 70 (zetia vs lipid clinic)  HTN - BP control on therapy  Prostate cancer, Testosterone replacement, and cardiovascular risk - based on the TRANSVERSE Trial from NEJM 2023 - Testosterone therapy in middle-aged and older men with hypogonadism compared to placebo does not increase major cardiovascular events. - The incidence of pulmonary embolism, nonfatal arrhythmia, atrial fibrillation was higher in subjects who received testosterone replacement therapy.  One year me      Medication Adjustments/Labs and Tests Ordered: Current medicines are reviewed at length with the patient today.  Concerns regarding medicines are outlined above.  Orders Placed This Encounter  Procedures   EKG 12-Lead   No orders of the defined types were placed in this encounter.   Patient Instructions  Medication Instructions:  Your physician recommends that you continue on your current medications as directed. Please refer to the Current Medication list given to you today.  *If you need a refill on your cardiac medications before your next appointment, please call your pharmacy*   Lab Work: NONE If you have labs (blood work) drawn today and your tests are completely normal, you will receive your results only by: [place] (if you have MyChart) OR A paper copy in the mail If you have any lab test that is abnormal or we need to change your treatment, we  will call you to review the results.   Testing/Procedures: Please ask Primary Care Provider to send results of labs to our office.  Fax number [phone].   Follow-Up: At [facility], you and your health needs are our priority.  As part of our continuing mission to provide you with exceptional heart care, we have created designated Provider Care Teams.  These Care Teams include your primary Cardiologist (physician) and Advanced Practice Providers (APPs -  Physician Assistants and Nurse Practitioners) who all work together to provide you with the care you need, when you need it.    Your next appointment:   1 year(s)  Provider:   Rudean Haskell, MD       Signed, Werner Lean, MD  06/23/2022 10:52 AM    [place]

## 2022-07-19 DIAGNOSIS — E7800 Pure hypercholesterolemia, unspecified: Secondary | ICD-10-CM | POA: Diagnosis not present

## 2022-07-19 DIAGNOSIS — I10 Essential (primary) hypertension: Secondary | ICD-10-CM | POA: Diagnosis not present

## 2022-07-19 DIAGNOSIS — E1165 Type 2 diabetes mellitus with hyperglycemia: Secondary | ICD-10-CM | POA: Diagnosis not present

## 2022-07-20 DIAGNOSIS — K2100 Gastro-esophageal reflux disease with esophagitis, without bleeding: Secondary | ICD-10-CM | POA: Diagnosis not present

## 2022-07-20 DIAGNOSIS — Z86010 Personal history of colonic polyps: Secondary | ICD-10-CM | POA: Diagnosis not present

## 2022-07-20 LAB — LAB REPORT - SCANNED
A1c: 5.7
EGFR: 65.3

## 2022-08-01 DIAGNOSIS — E782 Mixed hyperlipidemia: Secondary | ICD-10-CM | POA: Diagnosis not present

## 2022-08-01 DIAGNOSIS — E1121 Type 2 diabetes mellitus with diabetic nephropathy: Secondary | ICD-10-CM | POA: Diagnosis not present

## 2022-08-01 DIAGNOSIS — E1165 Type 2 diabetes mellitus with hyperglycemia: Secondary | ICD-10-CM | POA: Diagnosis not present

## 2022-08-01 DIAGNOSIS — I10 Essential (primary) hypertension: Secondary | ICD-10-CM | POA: Diagnosis not present

## 2022-08-25 ENCOUNTER — Telehealth: Payer: Self-pay

## 2022-08-25 DIAGNOSIS — I700 Atherosclerosis of aorta: Secondary | ICD-10-CM

## 2022-08-25 DIAGNOSIS — E782 Mixed hyperlipidemia: Secondary | ICD-10-CM

## 2022-08-25 DIAGNOSIS — I2510 Atherosclerotic heart disease of native coronary artery without angina pectoris: Secondary | ICD-10-CM

## 2022-08-25 MED ORDER — EZETIMIBE 10 MG PO TABS: 10.0000 mg | ORAL_TABLET | Freq: Every day | ORAL | 3 refills | Status: DC

## 2022-08-25 NOTE — Telephone Encounter (Signed)
Called pt advised MD has reviewed labs faxed over from PCP.  LDL 89 MD goal of 70 or below.  Pt is agreeable to start zetia 10 mg PO QD.  Will come in for FLP on July 22.  All questions answered script sent to pharmacy of pt choice.

## 2022-10-17 DIAGNOSIS — H5203 Hypermetropia, bilateral: Secondary | ICD-10-CM | POA: Diagnosis not present

## 2022-10-31 DIAGNOSIS — E782 Mixed hyperlipidemia: Secondary | ICD-10-CM | POA: Diagnosis not present

## 2022-10-31 DIAGNOSIS — I10 Essential (primary) hypertension: Secondary | ICD-10-CM | POA: Diagnosis not present

## 2022-10-31 DIAGNOSIS — E1165 Type 2 diabetes mellitus with hyperglycemia: Secondary | ICD-10-CM | POA: Diagnosis not present

## 2022-10-31 DIAGNOSIS — E559 Vitamin D deficiency, unspecified: Secondary | ICD-10-CM | POA: Diagnosis not present

## 2022-11-16 DIAGNOSIS — Z8546 Personal history of malignant neoplasm of prostate: Secondary | ICD-10-CM | POA: Diagnosis not present

## 2022-11-16 DIAGNOSIS — E291 Testicular hypofunction: Secondary | ICD-10-CM | POA: Diagnosis not present

## 2022-11-23 DIAGNOSIS — E291 Testicular hypofunction: Secondary | ICD-10-CM | POA: Diagnosis not present

## 2022-11-23 DIAGNOSIS — Z8546 Personal history of malignant neoplasm of prostate: Secondary | ICD-10-CM | POA: Diagnosis not present

## 2022-11-23 DIAGNOSIS — N401 Enlarged prostate with lower urinary tract symptoms: Secondary | ICD-10-CM | POA: Diagnosis not present

## 2022-11-23 DIAGNOSIS — R3915 Urgency of urination: Secondary | ICD-10-CM | POA: Diagnosis not present

## 2022-11-28 ENCOUNTER — Ambulatory Visit: Payer: Medicare Other | Attending: Internal Medicine

## 2022-11-28 DIAGNOSIS — I2510 Atherosclerotic heart disease of native coronary artery without angina pectoris: Secondary | ICD-10-CM | POA: Diagnosis not present

## 2022-11-28 DIAGNOSIS — E782 Mixed hyperlipidemia: Secondary | ICD-10-CM

## 2022-11-28 DIAGNOSIS — I700 Atherosclerosis of aorta: Secondary | ICD-10-CM

## 2022-11-28 DIAGNOSIS — I2584 Coronary atherosclerosis due to calcified coronary lesion: Secondary | ICD-10-CM | POA: Diagnosis not present

## 2022-11-29 LAB — LIPID PANEL
Chol/HDL Ratio: 3.2 ratio (ref 0.0–5.0)
Cholesterol, Total: 137 mg/dL (ref 100–199)
HDL: 43 mg/dL (ref >39)
LDL Chol Calc (NIH): 63 mg/dL (ref 0–99)
Triglycerides: 185 mg/dL — ABNORMAL HIGH (ref 0–149)
VLDL Cholesterol Cal: 31 mg/dL (ref 5–40)

## 2022-12-01 DIAGNOSIS — K080 Exfoliation of teeth due to systemic causes: Secondary | ICD-10-CM | POA: Diagnosis not present

## 2022-12-05 ENCOUNTER — Telehealth: Payer: Self-pay

## 2022-12-05 DIAGNOSIS — I2510 Atherosclerotic heart disease of native coronary artery without angina pectoris: Secondary | ICD-10-CM

## 2022-12-05 DIAGNOSIS — E782 Mixed hyperlipidemia: Secondary | ICD-10-CM

## 2022-12-05 DIAGNOSIS — I700 Atherosclerosis of aorta: Secondary | ICD-10-CM

## 2022-12-05 MED ORDER — PRAVASTATIN SODIUM 20 MG PO TABS: 20.0000 mg | ORAL_TABLET | Freq: Every evening | ORAL | 3 refills | Status: DC

## 2022-12-05 NOTE — Telephone Encounter (Signed)
The patient has been notified of the result and verbalized understanding.  All questions (if any) were answered. Caleb Right Marsalis Beaulieu, RN 12/05/2022 11:58 AM   Pt reports is currently taking Testosterone.  Will increase pravastatin as recommended by MD.  Will come in for repeat labs on 03/06/23.

## 2022-12-05 NOTE — Telephone Encounter (Signed)
-----   Message from Christell Constant sent at 11/29/2022  8:54 AM EDT ----- TGS above goal LDL is at the goal we set - if he is still on TRT, we will increase to 20 mg pravastatin and monitor for myalgias, lipids and ALT in 3 months

## 2023-03-06 ENCOUNTER — Telehealth: Payer: Self-pay | Admitting: Family Medicine

## 2023-03-06 ENCOUNTER — Ambulatory Visit: Payer: Medicare Other | Attending: Internal Medicine

## 2023-03-06 DIAGNOSIS — I700 Atherosclerosis of aorta: Secondary | ICD-10-CM | POA: Diagnosis not present

## 2023-03-06 DIAGNOSIS — E782 Mixed hyperlipidemia: Secondary | ICD-10-CM | POA: Diagnosis not present

## 2023-03-06 DIAGNOSIS — I2510 Atherosclerotic heart disease of native coronary artery without angina pectoris: Secondary | ICD-10-CM | POA: Diagnosis not present

## 2023-03-06 NOTE — Telephone Encounter (Signed)
Brother-in-law is an est pt of Dr. Paralee Cancel. This person would like to est care with provider also.

## 2023-03-07 DIAGNOSIS — J439 Emphysema, unspecified: Secondary | ICD-10-CM | POA: Diagnosis not present

## 2023-03-07 DIAGNOSIS — Z0000 Encounter for general adult medical examination without abnormal findings: Secondary | ICD-10-CM | POA: Diagnosis not present

## 2023-03-07 DIAGNOSIS — Z1339 Encounter for screening examination for other mental health and behavioral disorders: Secondary | ICD-10-CM | POA: Diagnosis not present

## 2023-03-07 DIAGNOSIS — Z1331 Encounter for screening for depression: Secondary | ICD-10-CM | POA: Diagnosis not present

## 2023-03-07 LAB — LIPID PANEL
Chol/HDL Ratio: 3.4 ratio (ref 0.0–5.0)
Cholesterol, Total: 152 mg/dL (ref 100–199)
HDL: 45 mg/dL (ref >39)
LDL Chol Calc (NIH): 77 mg/dL (ref 0–99)
Triglycerides: 179 mg/dL — ABNORMAL HIGH (ref 0–149)
VLDL Cholesterol Cal: 30 mg/dL (ref 5–40)

## 2023-03-07 LAB — ALT: ALT: 18 [IU]/L (ref 0–44)

## 2023-03-07 NOTE — Telephone Encounter (Signed)
Called, left VM and scheduled appt

## 2023-03-07 NOTE — Telephone Encounter (Signed)
Sent  to Dr.Greene 

## 2023-03-07 NOTE — Telephone Encounter (Signed)
Yes I will see him, okay to schedule establish care visit appointment.  Thanks

## 2023-03-30 ENCOUNTER — Encounter: Payer: Self-pay | Admitting: Family Medicine

## 2023-03-30 ENCOUNTER — Ambulatory Visit: Payer: Medicare Other | Admitting: Family Medicine

## 2023-03-30 VITALS — BP 136/80 | HR 86 | Temp 98.5°F | Ht 71.0 in | Wt 206.6 lb

## 2023-03-30 DIAGNOSIS — K219 Gastro-esophageal reflux disease without esophagitis: Secondary | ICD-10-CM

## 2023-03-30 DIAGNOSIS — Z8679 Personal history of other diseases of the circulatory system: Secondary | ICD-10-CM | POA: Diagnosis not present

## 2023-03-30 DIAGNOSIS — E785 Hyperlipidemia, unspecified: Secondary | ICD-10-CM | POA: Diagnosis not present

## 2023-03-30 DIAGNOSIS — C61 Malignant neoplasm of prostate: Secondary | ICD-10-CM | POA: Diagnosis not present

## 2023-03-30 DIAGNOSIS — Z8739 Personal history of other diseases of the musculoskeletal system and connective tissue: Secondary | ICD-10-CM

## 2023-03-30 NOTE — Patient Instructions (Signed)
See information below on managing blood pressure.  I do not think any medicine is needed right now but try to cut back on salt in the diet, cut back on alcohol should also help.  See handout on common foods that are high in salt.  Keep follow-up with cardiology as planned, and follow-up with me in 3 months for physical.  If there are any questions prior to that time, please let me know.  Take care.   Managing Your Hypertension Hypertension, also called high blood pressure, is when the force of the blood pressing against the walls of the arteries is too strong. Arteries are blood vessels that carry blood from your heart throughout your body. Hypertension forces the heart to work harder to pump blood and may cause the arteries to become narrow or stiff. Understanding blood pressure readings A blood pressure reading includes a higher number over a lower number: The first, or top, number is called the systolic pressure. It is a measure of the pressure in your arteries as your heart beats. The second, or bottom number, is called the diastolic pressure. It is a measure of the pressure in your arteries as the heart relaxes. For most people, a normal blood pressure is below 120/80. Your personal target blood pressure may vary depending on your medical conditions, your age, and other factors. Blood pressure is classified into four stages. Based on your blood pressure reading, your health care provider may use the following stages to determine what type of treatment you need, if any. Systolic pressure and diastolic pressure are measured in a unit called millimeters of mercury (mmHg). Normal Systolic pressure: below 120. Diastolic pressure: below 80. Elevated Systolic pressure: 120-129. Diastolic pressure: below 80. Hypertension stage 1 Systolic pressure: 130-139. Diastolic pressure: 80-89. Hypertension stage 2 Systolic pressure: 140 or above. Diastolic pressure: 90 or above. How can this condition affect  me? Managing your hypertension is very important. Over time, hypertension can damage the arteries and decrease blood flow to parts of the body, including the brain, heart, and kidneys. Having untreated or uncontrolled hypertension can lead to: A heart attack. A stroke. A weakened blood vessel (aneurysm). Heart failure. Kidney damage. Eye damage. Memory and concentration problems. Vascular dementia. What actions can I take to manage this condition? Hypertension can be managed by making lifestyle changes and possibly by taking medicines. Your health care provider will help you make a plan to bring your blood pressure within a normal range. You may be referred for counseling on a healthy diet and physical activity. Nutrition  Eat a diet that is high in fiber and potassium, and low in salt (sodium), added sugar, and fat. An example eating plan is called the DASH diet. DASH stands for Dietary Approaches to Stop Hypertension. To eat this way: Eat plenty of fresh fruits and vegetables. Try to fill one-half of your plate at each meal with fruits and vegetables. Eat whole grains, such as whole-wheat pasta, brown rice, or whole-grain bread. Fill about one-fourth of your plate with whole grains. Eat low-fat dairy products. Avoid fatty cuts of meat, processed or cured meats, and poultry with skin. Fill about one-fourth of your plate with lean proteins such as fish, chicken without skin, beans, eggs, and tofu. Avoid pre-made and processed foods. These tend to be higher in sodium, added sugar, and fat. Reduce your daily sodium intake. Many people with hypertension should eat less than 1,500 mg of sodium a day. Lifestyle  Work with your health care provider to  maintain a healthy body weight or to lose weight. Ask what an ideal weight is for you. Get at least 30 minutes of exercise that causes your heart to beat faster (aerobic exercise) most days of the week. Activities may include walking, swimming, or  biking. Include exercise to strengthen your muscles (resistance exercise), such as weight lifting, as part of your weekly exercise routine. Try to do these types of exercises for 30 minutes at least 3 days a week. Do not use any products that contain nicotine or tobacco. These products include cigarettes, chewing tobacco, and vaping devices, such as e-cigarettes. If you need help quitting, ask your health care provider. Control any long-term (chronic) conditions you have, such as high cholesterol or diabetes. Identify your sources of stress and find ways to manage stress. This may include meditation, deep breathing, or making time for fun activities. Alcohol use Do not drink alcohol if: Your health care provider tells you not to drink. You are pregnant, may be pregnant, or are planning to become pregnant. If you drink alcohol: Limit how much you have to: 0-1 drink a day for women. 0-2 drinks a day for men. Know how much alcohol is in your drink. In the [place] one drink equals one 12 oz bottle of beer (355 mL), one 5 oz glass of wine (148 mL), or one 1 oz glass of hard liquor (44 mL). Medicines Your health care provider may prescribe medicine if lifestyle changes are not enough to get your blood pressure under control and if: Your systolic blood pressure is 130 or higher. Your diastolic blood pressure is 80 or higher. Take medicines only as told by your health care provider. Follow the directions carefully. Blood pressure medicines must be taken as told by your health care provider. The medicine does not work as well when you skip doses. Skipping doses also puts you at risk for problems. Monitoring Before you monitor your blood pressure: Do not smoke, drink caffeinated beverages, or exercise within 30 minutes before taking a measurement. Use the bathroom and empty your bladder (urinate). Sit quietly for at least 5 minutes before taking measurements. Monitor your blood pressure at home as told  by your health care provider. To do this: Sit with your back straight and supported. Place your feet flat on the floor. Do not cross your legs. Support your arm on a flat surface, such as a table. Make sure your upper arm is at heart level. Each time you measure, take two or three readings one minute apart and record the results. You may also need to have your blood pressure checked regularly by your health care provider. General information Talk with your health care provider about your diet, exercise habits, and other lifestyle factors that may be contributing to hypertension. Review all the medicines you take with your health care provider because there may be side effects or interactions. Keep all follow-up visits. Your health care provider can help you create and adjust your plan for managing your high blood pressure. Where to find more information National Heart, Lung, and Blood Institute: [employer]Ste[employer].is American Heart Association: www.heart.org Contact a health care provider if: You think you are having a reaction to medicines you have taken. You have repeated (recurrent) headaches. You feel dizzy. You have swelling in your ankles. You have trouble with your vision. Get help right away if: You develop a severe headache or confusion. You have unusual weakness or numbness, or you feel faint. You have severe pain in your chest  or abdomen. You vomit repeatedly. You have trouble breathing. These symptoms may be an emergency. Get help right away. Call 911. Do not wait to see if the symptoms will go away. Do not drive yourself to the hospital. Summary Hypertension is when the force of blood pumping through your arteries is too strong. If this condition is not controlled, it may put you at risk for serious complications. Your personal target blood pressure may vary depending on your medical conditions, your age, and other factors. For most people, a normal blood pressure is less than  120/80. Hypertension is managed by lifestyle changes, medicines, or both. Lifestyle changes to help manage hypertension include losing weight, eating a healthy, low-sodium diet, exercising more, stopping smoking, and limiting alcohol. This information is not intended to replace advice given to you by your health care provider. Make sure you discuss any questions you have with your health care provider. Document Revised: 01/07/2021 Document Reviewed: 01/07/2021 Elsevier Patient Education  2024 [employer].

## 2023-03-30 NOTE — Progress Notes (Signed)
Subjective:  Patient ID: Caleb Barajas, male    DOB: Aug 08, 1950  Age: 72 y.o. MRN: [id number]  CC:  Chief Complaint  Patient presents with   New Patient (Initial Visit)    Pt here  to establish care     HPI Caleb Barajas presents for   New patient to establish care.  I care for his brother-in-law Ernst Bowler).   Previous primary care provider Dr. Duanne Guess, changes.  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    Hypertension: Diagnosed with HTN earlier this year and started on med. Valsartan.  Episode in the summer with lightheadedness with standing, no syncope. BP 91/63, 96/68 in July on meds - stopped meds at that point. Off meds since July Home readings:126/61.  No regular salt added to food.  Avoiding fast food. Some take out and restaurant food. Alcohol, 3-4 per night, rum. Cutting back past few months. Able to cut back to 2 per night recently. 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   Cardiology - Dr. Izora Ribas. Appt in February. Aortic atherosclerosis. Statin myopathy, but tolerating pravastatin 20mg  and zetia.  Recent labs -cardiology requested visit to discuss med options.  Dvt 4 years ago. Prolonged driving. Anticoagulation for  62mo. No recurrence.  eGFR 65 in March with Dr. Duanne Guess.  No new CP/dyspnea.  Smoking 3/4 ppd. Not ready to quit yet.   BP Readings from Last 3 Encounters:  03/30/23 136/80  06/23/22 109/70  02/14/18 129/84   Lab Results  Component Value Date   CREATININE 1.00 03/11/2016   Lab Results  Component Value Date   CHOL 152 03/06/2023   HDL 45 03/06/2023   LDLCALC 77 03/06/2023   TRIG 179 (H) 03/06/2023   CHOLHDL 3.4 03/06/2023   Diagnosed with rheumatoid arthritis - in 2015. Followed by rheumatologist 2016-2021. Dr. Kathi Ludwig. On plaquenil, sulfasalazine, methotrexate - not sure it helped. Stopped meds. No recent issues.    Followed by urologist (Dr. Annabell Howells) for prostate cancer - treated with flomax and  testosterone. Seed implant - low PSA on last testing.   On pepcid every day for GERD, hx of Barrett's esophagus per problem list. Occasional flare with certain foods. GI: Dr. Elnoria Howard. Last appt in January.   HM: Declines flu vaccine.  Declines covid booster.  Declines shingrix.  Td in past 10 years Declines pneumonia vaccine   History Patient Active Problem List   Diagnosis Date Noted   Essential hypertension 06/23/2022   Mixed hyperlipidemia 06/23/2022   Tobacco abuse 06/23/2022   Coronary artery calcification 06/23/2022   Aortic atherosclerosis (HCC) 06/23/2022   Myalgia due to statin 06/23/2022   Stage T1c adenocarcinoma of the prostate with Gleason score of 3+3 and a PSA of 5.53 - favorable risk 11/13/2012   Past Medical History:  Diagnosis Date   Acute embolism and thrombosis of other specified deep vein of unspecified lower extremity (HCC)    Asthma    as a child   Barrett esophagus    Cellulitis    DVT (deep venous thrombosis) (HCC)    Occlusive R DVT.    GERD (gastroesophageal reflux disease)    History of BPH    Hyperlipidemia    Hypogonadism male    Impotence of organic origin    Impotence of organic origin    OSA (obstructive sleep apnea)    states not using at this time   Prostate cancer [facility])    radioactive seedfs    Rheumatoid  aortitis    Rheumatoid arthritis(714.0)    Unspecified asthma(493.90)    Past Surgical History:  Procedure Laterality Date   CYSTOSCOPY WITH URETHRAL DILATATION N/A 03/15/2016   Procedure: CYSTOSCOPY WITH URETHRAL DILATATION OF STRICTURE;  Surgeon: Bjorn Pippin, MD;  Location: [facility];  Service: Urology;  Laterality: N/A;   INGUINAL HERNIA REPAIR Left 1960   PROSTATE BIOPSY     RADIOACTIVE SEED IMPLANT N/A 01/03/2013   Procedure: RADIOACTIVE SEED IMPLANT;  Surgeon: [name], MD;  [facility];  Service: Urology;  Laterality: N/A;   No Known Allergies Prior to Admission medications   Medication Sig Start  Date End Date Taking? Authorizing Provider  B Complex Vitamins (VITAMIN B COMPLEX PO) Take by mouth.   Yes [provider]  Cholecalciferol (VITAMIN D PO) Take 1 tablet by mouth daily.   Yes [provider]  ezetimibe (ZETIA) 10 MG tablet Take 1 tablet (10 mg total) by mouth daily. 08/25/22  Yes Chandrasekhar, Mahesh A, MD  famotidine (PEPCID) 40 MG tablet Take 40 mg by mouth daily. 06/16/22  Yes [provider]  fish oil-omega-3 fatty acids 1000 MG capsule Take 2 g by mouth daily.   Yes [provider]  pravastatin (PRAVACHOL) 20 MG tablet Take 1 tablet (20 mg total) by mouth every evening. 12/05/22  Yes Chandrasekhar, Mahesh A, MD  pyridOXINE (VITAMIN B-6) 100 MG tablet Take 100 mg by mouth daily.   Yes [provider]  tamsulosin (FLOMAX) 0.4 MG CAPS capsule Take 1 capsule (0.4 mg total) by mouth daily. Patient taking differently: Take 0.8 mg by mouth daily. 01/03/13  Yes Bjorn Pippin, MD  Testosterone (ANDROGEL) 20.25 MG/1.25GM (1.62%) GEL Place 2 application  onto the skin daily.   Yes [provider]  valsartan (DIOVAN) 80 MG tablet Take by mouth daily. 05/21/22  Yes [provider]   Social History   Socioeconomic History   Marital status: Married    Spouse name: Not on file   Number of children: Not on file   Years of education: Not on file   Highest education level: Associate degree: occupational, Scientist, product/process development, or vocational program  Occupational History   Not on file  Tobacco Use   Smoking status: Every Day    Current packs/day: 0.00    Average packs/day: 1 pack/day for 35.0 years (35.0 ttl pk-yrs)    Types: Cigarettes    Start date: 05/09/1966    Last attempt to quit: 05/09/2001    Years since quitting: 21.9   Smokeless tobacco: Never   Tobacco comments:    Smokes a fair amt in the car and travels alot.  Substance and Sexual Activity   Alcohol use: Yes    Comment: 2-3 per day   Drug use: No   Sexual activity: Not  Currently  Other Topics Concern   Not on file  Social History Narrative   Not on file   Social Determinants of Health   Financial Resource Strain: Low Risk  (03/26/2023)   Overall Financial Resource Strain (CARDIA)    Difficulty of Paying Living Expenses: Not hard at all  Food Insecurity: No Food Insecurity (03/26/2023)   Hunger Vital Sign    Worried About Running Out of Food in the Last Year: Never true    Ran Out of Food in the Last Year: Never true  Transportation Needs: No Transportation Needs (03/26/2023)   PRAPARE - Transportation    Lack of Transportation (Medical): No    Lack  of Transportation (Non-Medical): No  Physical Activity: Insufficiently Active (03/26/2023)   Exercise Vital Sign    Days of Exercise per Week: 2 days    Minutes of Exercise per Session: 30 min  Stress: No Stress Concern Present (03/26/2023)   [employer] of Occupational Health - Occupational Stress Questionnaire    Feeling of Stress : Not at all  Social Connections: Socially Isolated (03/26/2023)   Social Connection and Isolation Panel [NHANES]    Frequency of Communication with Friends and Family: Never    Frequency of Social Gatherings with Friends and Family: Never    Attends Religious Services: Never    Database administrator or Organizations: No    Attends Engineer, structural: Not on file    Marital Status: Married  Catering manager Violence: Not on file    Review of Systems  Per HPI Objective:   Vitals:   03/30/23 1510  BP: 136/80  Pulse: 86  Temp: 98.5 F (36.9 C)  TempSrc: Temporal  SpO2: 98%  Weight: 206 lb 9.6 oz (93.7 kg)  Height: 5\' 11"  (1.803 m)     Physical Exam Vitals reviewed.  Constitutional:      Appearance: He is well-developed.  HENT:     Head: Normocephalic and atraumatic.  Neck:     Vascular: No carotid bruit or JVD.  Cardiovascular:     Rate and Rhythm: Normal rate and regular rhythm.     Heart sounds: Normal heart sounds. No murmur  heard. Pulmonary:     Effort: Pulmonary effort is normal.     Breath sounds: Normal breath sounds. No rales.  Musculoskeletal:     Right lower leg: No edema.     Left lower leg: No edema.  Skin:    General: Skin is warm and dry.  Neurological:     Mental Status: He is alert and oriented to person, place, and time.  Psychiatric:        Mood and Affect: Mood normal.        Assessment & Plan:  Caleb Barajas is a 72 y.o. male . History of hypertension  -Borderline blood pressure off meds, commended on cutting back on alcohol, as well as handout given on management of hypertension, handout given on salty 6 foods for sodium avoidance which may be sufficient to manage his blood pressure.  No new meds for now.  Maintenance of hydration encouraged, previous hypotension could have been related to use of medication as well as relative volume depletion at that time.  RTC precautions.  Continue follow-up with cardiology as planned.  Hyperlipidemia, unspecified hyperlipidemia type  -Tolerating statin, Zetia, recent labs from cardiology, changes per cardiology.  Prostate cancer [facility])  -With hypogonadism, treated by urology.  Continue routine follow-up with urology including for labs, tamsulosin and testosterone per urology.  History of rheumatoid arthritis  -As above, prior treatment, reports no significant changes on meds.  Denies recent flare of symptoms, but if that were to occur would recommend follow-up with dermatology and I can place new referral if needed.  Gastroesophageal reflux disease, unspecified whether esophagitis present  -Followed by gastroenterology, on famotidine.  Overall stable, trigger avoidance, continue routine follow-up with GI.  7-month follow-up for physical.  Labs were checked in March by prior PCP and recent lipids by cardiology.  No orders of the defined types were placed in this encounter.  Patient Instructions  See information below on managing blood  pressure.  I do not think any medicine is  needed right now but try to cut back on salt in the diet, cut back on alcohol should also help.  See handout on common foods that are high in salt.  Keep follow-up with cardiology as planned, and follow-up with me in 3 months for physical.  If there are any questions prior to that time, please let me know.  Take care.   Managing Your Hypertension Hypertension, also called high blood pressure, is when the force of the blood pressing against the walls of the arteries is too strong. Arteries are blood vessels that carry blood from your heart throughout your body. Hypertension forces the heart to work harder to pump blood and may cause the arteries to become narrow or stiff. Understanding blood pressure readings A blood pressure reading includes a higher number over a lower number: The first, or top, number is called the systolic pressure. It is a measure of the pressure in your arteries as your heart beats. The second, or bottom number, is called the diastolic pressure. It is a measure of the pressure in your arteries as the heart relaxes. For most people, a normal blood pressure is below 120/80. Your personal target blood pressure may vary depending on your medical conditions, your age, and other factors. Blood pressure is classified into four stages. Based on your blood pressure reading, your health care provider may use the following stages to determine what type of treatment you need, if any. Systolic pressure and diastolic pressure are measured in a unit called millimeters of mercury (mmHg). Normal Systolic pressure: below 120. Diastolic pressure: below 80. Elevated Systolic pressure: 120-129. Diastolic pressure: below 80. Hypertension stage 1 Systolic pressure: 130-139. Diastolic pressure: 80-89. Hypertension stage 2 Systolic pressure: 140 or above. Diastolic pressure: 90 or above. How can this condition affect me? Managing your hypertension is very  important. Over time, hypertension can damage the arteries and decrease blood flow to parts of the body, including the brain, heart, and kidneys. Having untreated or uncontrolled hypertension can lead to: A heart attack. A stroke. A weakened blood vessel (aneurysm). Heart failure. Kidney damage. Eye damage. Memory and concentration problems. Vascular dementia. What actions can I take to manage this condition? Hypertension can be managed by making lifestyle changes and possibly by taking medicines. Your health care provider will help you make a plan to bring your blood pressure within a normal range. You may be referred for counseling on a healthy diet and physical activity. Nutrition  Eat a diet that is high in fiber and potassium, and low in salt (sodium), added sugar, and fat. An example eating plan is called the DASH diet. DASH stands for Dietary Approaches to Stop Hypertension. To eat this way: Eat plenty of fresh fruits and vegetables. Try to fill one-half of your plate at each meal with fruits and vegetables. Eat whole grains, such as whole-wheat pasta, brown rice, or whole-grain bread. Fill about one-fourth of your plate with whole grains. Eat low-fat dairy products. Avoid fatty cuts of meat, processed or cured meats, and poultry with skin. Fill about one-fourth of your plate with lean proteins such as fish, chicken without skin, beans, eggs, and tofu. Avoid pre-made and processed foods. These tend to be higher in sodium, added sugar, and fat. Reduce your daily sodium intake. Many people with hypertension should eat less than 1,500 mg of sodium a day. Lifestyle  Work with your health care provider to maintain a healthy body weight or to lose weight. Ask what an ideal weight is  for you. Get at least 30 minutes of exercise that causes your heart to beat faster (aerobic exercise) most days of the week. Activities may include walking, swimming, or biking. Include exercise to strengthen  your muscles (resistance exercise), such as weight lifting, as part of your weekly exercise routine. Try to do these types of exercises for 30 minutes at least 3 days a week. Do not use any products that contain nicotine or tobacco. These products include cigarettes, chewing tobacco, and vaping devices, such as e-cigarettes. If you need help quitting, ask your health care provider. Control any long-term (chronic) conditions you have, such as high cholesterol or diabetes. Identify your sources of stress and find ways to manage stress. This may include meditation, deep breathing, or making time for fun activities. Alcohol use Do not drink alcohol if: Your health care provider tells you not to drink. You are pregnant, may be pregnant, or are planning to become pregnant. If you drink alcohol: Limit how much you have to: 0-1 drink a day for women. 0-2 drinks a day for men. Know how much alcohol is in your drink. In the [place] one drink equals one 12 oz bottle of beer (355 mL), one 5 oz glass of wine (148 mL), or one 1 oz glass of hard liquor (44 mL). Medicines Your health care provider may prescribe medicine if lifestyle changes are not enough to get your blood pressure under control and if: Your systolic blood pressure is 130 or higher. Your diastolic blood pressure is 80 or higher. Take medicines only as told by your health care provider. Follow the directions carefully. Blood pressure medicines must be taken as told by your health care provider. The medicine does not work as well when you skip doses. Skipping doses also puts you at risk for problems. Monitoring Before you monitor your blood pressure: Do not smoke, drink caffeinated beverages, or exercise within 30 minutes before taking a measurement. Use the bathroom and empty your bladder (urinate). Sit quietly for at least 5 minutes before taking measurements. Monitor your blood pressure at home as told by your health care provider. To do  this: Sit with your back straight and supported. Place your feet flat on the floor. Do not cross your legs. Support your arm on a flat surface, such as a table. Make sure your upper arm is at heart level. Each time you measure, take two or three readings one minute apart and record the results. You may also need to have your blood pressure checked regularly by your health care provider. General information Talk with your health care provider about your diet, exercise habits, and other lifestyle factors that may be contributing to hypertension. Review all the medicines you take with your health care provider because there may be side effects or interactions. Keep all follow-up visits. Your health care provider can help you create and adjust your plan for managing your high blood pressure. Where to find more information National Heart, Lung, and Blood Institute: PopSteam.is American Heart Association: www.heart.org Contact a health care provider if: You think you are having a reaction to medicines you have taken. You have repeated (recurrent) headaches. You feel dizzy. You have swelling in your ankles. You have trouble with your vision. Get help right away if: You develop a severe headache or confusion. You have unusual weakness or numbness, or you feel faint. You have severe pain in your chest or abdomen. You vomit repeatedly. You have trouble breathing. These symptoms may be an emergency.  Get help right away. Call 911. Do not wait to see if the symptoms will go away. Do not drive yourself to the hospital. Summary Hypertension is when the force of blood pumping through your arteries is too strong. If this condition is not controlled, it may put you at risk for serious complications. Your personal target blood pressure may vary depending on your medical conditions, your age, and other factors. For most people, a normal blood pressure is less than 120/80. Hypertension is managed by  lifestyle changes, medicines, or both. Lifestyle changes to help manage hypertension include losing weight, eating a healthy, low-sodium diet, exercising more, stopping smoking, and limiting alcohol. This information is not intended to replace advice given to you by your health care provider. Make sure you discuss any questions you have with your health care provider. Document Revised: 01/07/2021 Document Reviewed: 01/07/2021 Elsevier Patient Education  2024 Elsevier Inc.     Signed,   Meredith Staggers, MD [facility], [facility] 03/30/23 5:27 PM

## 2023-04-18 ENCOUNTER — Other Ambulatory Visit: Payer: Self-pay

## 2023-04-18 DIAGNOSIS — I2510 Atherosclerotic heart disease of native coronary artery without angina pectoris: Secondary | ICD-10-CM

## 2023-04-18 DIAGNOSIS — I700 Atherosclerosis of aorta: Secondary | ICD-10-CM

## 2023-04-18 DIAGNOSIS — E782 Mixed hyperlipidemia: Secondary | ICD-10-CM

## 2023-04-18 DIAGNOSIS — T466X5A Adverse effect of antihyperlipidemic and antiarteriosclerotic drugs, initial encounter: Secondary | ICD-10-CM

## 2023-04-18 NOTE — Progress Notes (Signed)
Order for lipid clinic placed based on results from 03/06/23.

## 2023-04-19 ENCOUNTER — Other Ambulatory Visit: Payer: Self-pay | Admitting: Acute Care

## 2023-04-19 DIAGNOSIS — F17210 Nicotine dependence, cigarettes, uncomplicated: Secondary | ICD-10-CM

## 2023-04-19 DIAGNOSIS — Z87891 Personal history of nicotine dependence: Secondary | ICD-10-CM

## 2023-04-19 DIAGNOSIS — Z122 Encounter for screening for malignant neoplasm of respiratory organs: Secondary | ICD-10-CM

## 2023-04-20 ENCOUNTER — Telehealth: Payer: Self-pay | Admitting: Pharmacy Technician

## 2023-04-20 ENCOUNTER — Other Ambulatory Visit (HOSPITAL_COMMUNITY): Payer: Self-pay

## 2023-04-20 ENCOUNTER — Ambulatory Visit: Payer: Medicare Other | Attending: Cardiology | Admitting: Student

## 2023-04-20 ENCOUNTER — Encounter: Payer: Self-pay | Admitting: Student

## 2023-04-20 ENCOUNTER — Telehealth: Payer: Self-pay | Admitting: Pharmacist

## 2023-04-20 DIAGNOSIS — E782 Mixed hyperlipidemia: Secondary | ICD-10-CM

## 2023-04-20 NOTE — Patient Instructions (Signed)
Your Results:             Your most recent labs Goal  Total Cholesterol 152 < 200  Triglycerides 179 < 150  HDL (happy/good cholesterol) 45 > 40  LDL (lousy/bad cholesterol 77 < 70   Medication changes: We will start the process to get PCSK9i (Repatha or Praluent) covered by your insurance.  Once the prior authorization is complete, we will call you to let you know and confirm pharmacy information.    Praluent is a cholesterol medication that improved your body's ability to get rid of "bad cholesterol" known as LDL. It can lower your LDL up to 60%. It is an injection that is given under the skin every 2 weeks. The most common side effects of Praluent include runny nose, symptoms of the common cold, rarely flu or flu-like symptoms, back/muscle pain in about 3-4% of the patients, and redness, pain, or bruising at the injection site.    Repatha is a cholesterol medication that improved your body's ability to get rid of "bad cholesterol" known as LDL. It can lower your LDL up to 60%! It is an injection that is given under the skin every 2 weeks. The most common side effects of Repatha include runny nose, symptoms of the common cold, rarely flu or flu-like symptoms, back/muscle pain in about 3-4% of the patients, and redness, pain, or bruising at the injection site.   Lab orders: We want to repeat labs after 2-3 months.  We will send you a lab order to remind you once we get closer to that time.        Copay Assistance:  The [employer] foundation offers assistance to help pay for medication copays.  They will cover copays for all cholesterol lowering meds, including statins, fibrates, omega-3 oils, ezetimibe, Repatha, Praluent, Nexletol, Nexlizet.  The cards are usually good for $2,500 or 12 months, whichever comes first. Go to healthwellfoundation.org Click on "Apply Now" Answer questions as to whom is applying (patient or representative) Your disease fund will be "hypercholesterolemia -  Medicare access" Select the cholesterol medication you need assistance with (Repatha, Praluent, Nexlizet...) They will ask question about qualifying diagnosis - you can mark "yes"; and do you have insurance coverage.   When they ask what type of assistance you are interested in - "copay assistance" When you submit, the approval is usually within minutes.  You will need to print the card information from the site You will need to show this information to your pharmacy, they will bill your Medicare Part D plan first -then bill [employer] --for the copay.   You can also call them at [phone], although the hold times can be quite long.

## 2023-04-20 NOTE — Progress Notes (Signed)
Patient ID: Caleb Barajas                 DOB: 1951/04/08                    MRN: [id number]      HPI: Caleb Barajas is a 72 y.o. male patient referred to lipid clinic by Dr.Chandrasekhar . PMH is significant for HTN and HLD, former smoker, distant hx of DVT. Had LAD CAC and aortic atherosclerosis.   Patient presented today for lipid clinic. Reports he has been tolerating Pravastatin and Zetia well. We discussed lipid lab in detail and goal LDLc and TG. He drinks 3 drink every night willing to cut down to 2 per night and cut down on smoking too. The wife does cooking and she does not like to cook so they eat out everyday. Does not want to change diet and eating out. He does not have exercise regimen but he stays active around the house doing household projects.  Reviewed options for lowering LDL cholesterol, including , PCSK-9 inhibitors.  Discussed mechanisms of action, dosing, side effects and potential decreases in LDL cholesterol.  Also reviewed cost information and potential options for patient assistance.  Current Medications: Pravastatin 20 mg daily and Zetia 10 mg daily  Intolerances: none  Risk Factors: CAD, hypertension, HLD,  smoker  LDL goal: <70 mg/dl   Diet: eats out  7 days a week.   Exercise: stay active around the house and remodeling house   Social History:  Alcohol: 3 drinks per night  Smoking: 15 cig per day  Labs: Lipid Panel     Component Value Date/Time   CHOL 152 03/06/2023 0842   TRIG 179 (H) 03/06/2023 0842   HDL 45 03/06/2023 0842   CHOLHDL 3.4 03/06/2023 0842   LDLCALC 77 03/06/2023 0842   LABVLDL 30 03/06/2023 0842    Past Medical History:  Diagnosis Date   Acute embolism and thrombosis of other specified deep vein of unspecified lower extremity (HCC)    Asthma    as a child   Barrett esophagus    Cellulitis    DVT (deep venous thrombosis) (HCC)    Occlusive R DVT.    GERD (gastroesophageal reflux disease)    History of BPH    Hyperlipidemia     Hypogonadism male    Impotence of organic origin    Impotence of organic origin    OSA (obstructive sleep apnea)    states not using at this time   Prostate cancer [facility])    radioactive seedfs    Rheumatoid aortitis    Rheumatoid arthritis(714.0)    Unspecified asthma(493.90)     Current Outpatient Medications on File Prior to Visit  Medication Sig Dispense Refill   B Complex Vitamins (VITAMIN B COMPLEX PO) Take by mouth.     Cholecalciferol (VITAMIN D PO) Take 1 tablet by mouth daily.     ezetimibe (ZETIA) 10 MG tablet Take 1 tablet (10 mg total) by mouth daily. 90 tablet 3   famotidine (PEPCID) 40 MG tablet Take 40 mg by mouth daily.     fish oil-omega-3 fatty acids 1000 MG capsule Take 2 g by mouth daily.     pravastatin (PRAVACHOL) 20 MG tablet Take 1 tablet (20 mg total) by mouth every evening. 90 tablet 3   pyridOXINE (VITAMIN B-6) 100 MG tablet Take 100 mg by mouth daily.     tamsulosin (FLOMAX) 0.4 MG CAPS capsule Take 1  capsule (0.4 mg total) by mouth daily. (Patient taking differently: Take 0.8 mg by mouth daily.) 30 capsule 1   Testosterone (ANDROGEL) 20.25 MG/1.25GM (1.62%) GEL Place 2 application  onto the skin daily.     valsartan (DIOVAN) 80 MG tablet Take by mouth daily.     No current facility-administered medications on file prior to visit.    No Known Allergies  Assessment/Plan:  1. Hyperlipidemia -  Problem  Mixed Hyperlipidemia   Current Medications: Pravastatin 20 mg daily and Zetia 10 mg daily  Intolerances: none  Risk Factors: CAD, hypertension, HLD, tobacco smoking  LDL goal: <70 mg/dl     Mixed hyperlipidemia Assessment:  LDL goal: < 70 mg/dl last LDLc still above goal on max tolerated statin and Zetia TG level 179mg /dl  Tolerates Zetia and pravastatin 20 mg well without any side effects  Discussed next potential options (PCSK-9 inhibitors, bempedoic acid and inclisiran); cost, dosing efficacy, side effects  Reiterated importance of healthy  lifestyle; willing to cut down on EtOh and Smoking but not ready for dietary changes   Plan: Continue taking current medications (Pravastatin 20 mg daily and Zetia 10 mg daily) Will apply for PA for PCSK9i; will inform patient upon approval  Lipid lab due in 2-3 months after starting PCSK9i   Thank you,  Carmela Hurt, Pharm.D [facility] of [facility] [address] [place], [place] [address]  Phone: [phone]; Fax: [phone]

## 2023-04-20 NOTE — Assessment & Plan Note (Addendum)
Assessment:  LDL goal: < 70 mg/dl last LDLc still above goal on max tolerated statin and Zetia TG level 179mg /dl  Tolerates Zetia and pravastatin 20 mg well without any side effects  Discussed next potential options (PCSK-9 inhibitors, bempedoic acid and inclisiran); cost, dosing efficacy, side effects  Reiterated importance of healthy lifestyle; willing to cut down on EtOh and Smoking but not ready for dietary changes   Plan: Continue taking current medications (Pravastatin 20 mg daily and Zetia 10 mg daily) Will apply for PA for PCSK9i; will inform patient upon approval  Lipid lab due in 2-3 months after starting [facility]

## 2023-04-20 NOTE — Telephone Encounter (Signed)
Pharmacy Patient Advocate Encounter   Received notification from Pt Calls Messages that prior authorization for repatha is required/requested.   Insurance verification completed.   The patient is insured through [facility] .   Per test claim: PA required; PA submitted to above mentioned insurance via CoverMyMeds Key/confirmation #/EOC [id number] Status is pending

## 2023-04-21 ENCOUNTER — Other Ambulatory Visit (HOSPITAL_COMMUNITY): Payer: Self-pay

## 2023-04-21 MED ORDER — REPATHA SURECLICK 140 MG/ML ~~LOC~~ SOAJ
140.0000 mg | SUBCUTANEOUS | 3 refills | Status: DC
  Filled 2023-04-21: qty 6, 84d supply, fill #0

## 2023-04-21 NOTE — Addendum Note (Signed)
Addended by: Tylene Fantasia on: 04/21/2023 04:31 PM   Modules accepted: Orders

## 2023-04-21 NOTE — Telephone Encounter (Signed)
Patient made aware of approval. Enrolled him in grant and f/u lab due on Jul 06, 2023.

## 2023-04-21 NOTE — Telephone Encounter (Signed)
Pharmacy Patient Advocate Encounter  Received notification from [facility] that Prior Authorization for repatha has been APPROVED from 04/21/23 to 04/20/24. Ran test claim, Copay is $45.00 one month. This test claim was processed through [facility]- copay amounts may vary at other pharmacies due to pharmacy/plan contracts, or as the patient moves through the different stages of their insurance plan.   PA #/Case ID/Reference #: [id number]

## 2023-04-24 NOTE — Telephone Encounter (Signed)
PA approved.

## 2023-04-26 DIAGNOSIS — H524 Presbyopia: Secondary | ICD-10-CM | POA: Diagnosis not present

## 2023-05-02 ENCOUNTER — Other Ambulatory Visit (HOSPITAL_COMMUNITY): Payer: Self-pay

## 2023-05-22 ENCOUNTER — Ambulatory Visit
Admission: RE | Admit: 2023-05-22 | Discharge: 2023-05-22 | Disposition: A | Discharge status: Home / Self Care | Payer: Medicare Other | Source: Ambulatory Visit | Attending: Acute Care | Admitting: Acute Care

## 2023-05-22 DIAGNOSIS — Z87891 Personal history of nicotine dependence: Secondary | ICD-10-CM

## 2023-05-22 DIAGNOSIS — F17210 Nicotine dependence, cigarettes, uncomplicated: Secondary | ICD-10-CM

## 2023-05-22 DIAGNOSIS — E291 Testicular hypofunction: Secondary | ICD-10-CM | POA: Diagnosis not present

## 2023-05-22 DIAGNOSIS — Z8546 Personal history of malignant neoplasm of prostate: Secondary | ICD-10-CM | POA: Diagnosis not present

## 2023-05-22 DIAGNOSIS — Z122 Encounter for screening for malignant neoplasm of respiratory organs: Secondary | ICD-10-CM

## 2023-05-29 ENCOUNTER — Other Ambulatory Visit (HOSPITAL_COMMUNITY): Payer: Self-pay

## 2023-05-29 ENCOUNTER — Other Ambulatory Visit: Payer: Self-pay

## 2023-05-29 DIAGNOSIS — Z122 Encounter for screening for malignant neoplasm of respiratory organs: Secondary | ICD-10-CM

## 2023-05-29 DIAGNOSIS — R3912 Poor urinary stream: Secondary | ICD-10-CM | POA: Diagnosis not present

## 2023-05-29 DIAGNOSIS — F17210 Nicotine dependence, cigarettes, uncomplicated: Secondary | ICD-10-CM

## 2023-05-29 DIAGNOSIS — Z8546 Personal history of malignant neoplasm of prostate: Secondary | ICD-10-CM | POA: Diagnosis not present

## 2023-05-29 DIAGNOSIS — Z87891 Personal history of nicotine dependence: Secondary | ICD-10-CM

## 2023-05-29 DIAGNOSIS — E291 Testicular hypofunction: Secondary | ICD-10-CM | POA: Diagnosis not present

## 2023-05-29 DIAGNOSIS — N401 Enlarged prostate with lower urinary tract symptoms: Secondary | ICD-10-CM | POA: Diagnosis not present

## 2023-05-29 MED ORDER — TESTOSTERONE 20.25 MG/ACT (1.62%) TD GEL
2.5000 g | Freq: Every morning | TRANSDERMAL | 5 refills | Status: DC
  Filled 2023-05-29 – 2023-06-29 (×2): qty 75, 30d supply, fill #0
  Filled 2023-07-30: qty 75, 30d supply, fill #1
  Filled 2023-08-31: qty 75, 30d supply, fill #2
  Filled 2023-10-10: qty 75, 30d supply, fill #3
  Filled 2023-11-14: qty 75, 30d supply, fill #4

## 2023-06-01 ENCOUNTER — Other Ambulatory Visit (HOSPITAL_COMMUNITY): Payer: Self-pay

## 2023-06-02 ENCOUNTER — Other Ambulatory Visit (HOSPITAL_COMMUNITY): Payer: Self-pay

## 2023-06-06 DIAGNOSIS — K080 Exfoliation of teeth due to systemic causes: Secondary | ICD-10-CM | POA: Diagnosis not present

## 2023-06-07 DIAGNOSIS — K080 Exfoliation of teeth due to systemic causes: Secondary | ICD-10-CM | POA: Diagnosis not present

## 2023-06-13 DIAGNOSIS — K080 Exfoliation of teeth due to systemic causes: Secondary | ICD-10-CM | POA: Diagnosis not present

## 2023-06-29 ENCOUNTER — Other Ambulatory Visit (HOSPITAL_COMMUNITY): Payer: Self-pay

## 2023-07-05 ENCOUNTER — Other Ambulatory Visit (HOSPITAL_COMMUNITY): Payer: Self-pay

## 2023-07-05 ENCOUNTER — Encounter: Payer: Self-pay | Admitting: Family Medicine

## 2023-07-05 ENCOUNTER — Ambulatory Visit (INDEPENDENT_AMBULATORY_CARE_PROVIDER_SITE_OTHER): Payer: Medicare Other | Admitting: Family Medicine

## 2023-07-05 VITALS — BP 122/80 | HR 82 | Temp 98.9°F | Ht 71.0 in | Wt 213.2 lb

## 2023-07-05 DIAGNOSIS — Z720 Tobacco use: Secondary | ICD-10-CM

## 2023-07-05 DIAGNOSIS — K219 Gastro-esophageal reflux disease without esophagitis: Secondary | ICD-10-CM | POA: Diagnosis not present

## 2023-07-05 DIAGNOSIS — Z860100 Personal history of colon polyps, unspecified: Secondary | ICD-10-CM | POA: Insufficient documentation

## 2023-07-05 DIAGNOSIS — E785 Hyperlipidemia, unspecified: Secondary | ICD-10-CM

## 2023-07-05 DIAGNOSIS — F339 Major depressive disorder, recurrent, unspecified: Secondary | ICD-10-CM

## 2023-07-05 DIAGNOSIS — Z8679 Personal history of other diseases of the circulatory system: Secondary | ICD-10-CM

## 2023-07-05 DIAGNOSIS — Z8739 Personal history of other diseases of the musculoskeletal system and connective tissue: Secondary | ICD-10-CM | POA: Diagnosis not present

## 2023-07-05 DIAGNOSIS — C61 Malignant neoplasm of prostate: Secondary | ICD-10-CM

## 2023-07-05 MED ORDER — BUPROPION HCL ER (XL) 150 MG PO TB24
150.0000 mg | ORAL_TABLET | Freq: Every day | ORAL | 1 refills | Status: DC
  Filled 2023-07-05: qty 30, 30d supply, fill #0
  Filled 2023-07-30: qty 30, 30d supply, fill #1

## 2023-07-05 NOTE — Progress Notes (Signed)
 Subjective:  Patient ID: Caleb Barajas, male    DOB: 09-Dec-1950  Age: 73 y.o. MRN: [id number]  CC:  Chief Complaint  Patient presents with   Medical Management of Chronic Issues    Pt is well no questions     HPI Caleb Barajas presents for   Follow-up.  Last visit in November for establish care visit, previously followed by Dr. Duanne Guess.   Hypertension: Diagnosed last year and started on meds at that time.  Did have an episode of lightheadedness in the summer with low blood pressure and stop meds.  He had been off medication since July of last year when I saw him in November.  He was avoiding fast food, no regular salt added to food.  Had been cutting back on alcohol to 2 per night.  Aortic atherosclerosis noted previously, followed by cardiology.  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  Plan for cardiology follow-up to discuss medication options.  Repatha was ordered. Cost prohibitive.  Still on pravastatin/zetia. No new myalgias.  No recent need for meds.  Home readings: 130-140  136/86 at dentist office. 117/84.  BP Readings from Last 3 Encounters:  07/05/23 122/80  03/30/23 136/80  06/23/22 109/70   Lab Results  Component Value Date   CREATININE 1.00 03/11/2016   Lab Results  Component Value Date   CHOL 152 03/06/2023   HDL 45 03/06/2023   LDLCALC 77 03/06/2023   TRIG 179 (H) 03/06/2023   CHOLHDL 3.4 03/06/2023   The 10-year ASCVD risk score (Arnett DK, et al., 2019) is: 37.2%   Values used to calculate the score:     Age: 36 years     Sex: Male     Is Non-Hispanic African American: No     Diabetic: Yes     Tobacco smoker: Yes     Systolic Blood Pressure: 122 mmHg     Is BP treated: Yes     HDL Cholesterol: 45 mg/dL     Total Cholesterol: 152 mg/dL   Tobacco abuse Three-quarter pack per day smoker when discussed in November and was not ready to quit at that time. Same amount. Considering quitting, not  ready yet.   Prostate cancer with hypogonadism Followed by urology.  Testosterone, tamsulosin with urology. Appt 1/22 - PSa undetectable. Continued on testosterone topical. 6 month follow up.   History of rheumatoid arthritis Previously treated by rheumatology, Dr. Kathi Ludwig, prior use of Plaquenil, sulfasalazine, methotrexate with unknown benefit, so stopped meds.  No recent issues were discussed in November. No recent flares.   GERD with Barrett's esophagus Gastroenterology Dr. Elnoria Howard.  Pepcid daily for management of symptoms. Stable.   Health maintenance, he has declined flu, COVID, shingles vaccines as well as pneumonia vaccination.  Hep C screening - today.   Some skin changes in sun areas, no new moles, declines derm referral.    At end of visit - additional concern:  Possible depression. Has been on sertraline in past - none in few years.  Decreased motivation. Past year or so. Down at times. Irritable at times.  No SI/HI.  No otc tx.  Plans on possibly quitting smoking. Would like to try wellbutirin.      07/05/2023    1:49 PM 03/30/2023    3:09 PM 04/13/2015    5:35 PM  Depression screen PHQ 2/9  Decreased Interest 1 1 0  Down, Depressed, Hopeless 2 0 0  PHQ - 2 Score 3 1 0  Altered sleeping 0 0   Tired, decreased energy 1 0   Change in appetite 0 0   Feeling bad or failure about yourself  0 0   Trouble concentrating 0 0   Moving slowly or fidgety/restless 0 0   Suicidal thoughts 0 0   PHQ-9 Score 4 1        History Patient Active Problem List   Diagnosis Date Noted   History of colonic polyps 07/05/2023   Essential hypertension 06/23/2022   Mixed hyperlipidemia 06/23/2022   Tobacco abuse 06/23/2022   Coronary artery calcification 06/23/2022   Aortic atherosclerosis (HCC) 06/23/2022   Myalgia due to statin 06/23/2022   Stage T1c adenocarcinoma of the prostate with Gleason score of 3+3 and a PSA of 5.53 - favorable risk 11/13/2012   Past Medical History:   Diagnosis Date   Acute embolism and thrombosis of other specified deep vein of unspecified lower extremity (HCC)    Asthma    as a child   Barrett esophagus    Cellulitis    DVT (deep venous thrombosis) (HCC)    Occlusive R DVT.    GERD (gastroesophageal reflux disease)    History of BPH    Hyperlipidemia    Hypogonadism male    Impotence of organic origin    Impotence of organic origin    OSA (obstructive sleep apnea)    states not using at this time   Prostate cancer (HCC)    radioactive seedfs    Rheumatoid aortitis    Rheumatoid arthritis(714.0)    Unspecified asthma(493.90)    Past Surgical History:  Procedure Laterality Date   CYSTOSCOPY WITH URETHRAL DILATATION N/A 03/15/2016   Procedure: CYSTOSCOPY WITH URETHRAL DILATATION OF STRICTURE;  Surgeon: Bjorn Pippin, MD;  Location: [facility];  Service: Urology;  Laterality: N/A;   INGUINAL HERNIA REPAIR Left 1960   PROSTATE BIOPSY     RADIOACTIVE SEED IMPLANT N/A 01/03/2013   Procedure: RADIOACTIVE SEED IMPLANT;  Surgeon: [name], MD;  Location: [facility];  Service: Urology;  Laterality: N/A;   No Known Allergies Prior to Admission medications   Medication Sig Start Date End Date Taking? Authorizing Provider  B Complex Vitamins (VITAMIN B COMPLEX PO) Take by mouth.    [provider]  Cholecalciferol (VITAMIN D PO) Take 1 tablet by mouth daily.    [provider]  Evolocumab (REPATHA SURECLICK) 140 MG/ML SOAJ Inject 140 mg into the skin every 14 (fourteen) days. 04/21/23   Christell Constant, MD  ezetimibe (ZETIA) 10 MG tablet Take 1 tablet (10 mg total) by mouth daily. 08/25/22   Christell Constant, MD  famotidine (PEPCID) 40 MG tablet Take 40 mg by mouth daily. 06/16/22   [provider]  fish oil-omega-3 fatty acids 1000 MG capsule Take 2 g by mouth daily.    [provider]  pravastatin (PRAVACHOL) 20 MG tablet Take 1 tablet (20 mg total) by mouth every evening.  12/05/22   Chandrasekhar, Mahesh A, MD  pyridOXINE (VITAMIN B-6) 100 MG tablet Take 100 mg by mouth daily.    [provider]  tamsulosin (FLOMAX) 0.4 MG CAPS capsule Take 1 capsule (0.4 mg total) by mouth daily. Patient taking differently: Take 0.8 mg by mouth daily. 01/03/13   Bjorn Pippin, MD  Testosterone (ANDROGEL) 20.25 MG/1.25GM (1.62%) GEL Place 2 application  onto the skin daily.    [provider]  Testosterone 20.25 MG/ACT (  1.62%) GEL Apply 2.5 g (1 pump to each shoulder) topically in the morning. 05/29/23     valsartan (DIOVAN) 80 MG tablet Take by mouth daily. 05/21/22   [provider]   Social History   Socioeconomic History   Marital status: Married    Spouse name: Not on file   Number of children: Not on file   Years of education: Not on file   Highest education level: Associate degree: occupational, Scientist, product/process development, or vocational program  Occupational History   Not on file  Tobacco Use   Smoking status: Every Day    Current packs/day: 0.00    Average packs/day: 1 pack/day for 35.0 years (35.0 ttl pk-yrs)    Types: Cigarettes    Start date: 05/09/1966    Last attempt to quit: 05/09/2001    Years since quitting: 22.1   Smokeless tobacco: Never   Tobacco comments:    Smokes a fair amt in the car and travels alot.  Substance and Sexual Activity   Alcohol use: Yes    Comment: 2-3 per day   Drug use: No   Sexual activity: Not Currently  Other Topics Concern   Not on file  Social History Narrative   Not on file   Social Drivers of Health   Financial Resource Strain: Low Risk  (07/01/2023)   Overall Financial Resource Strain (CARDIA)    Difficulty of Paying Living Expenses: Not hard at all  Food Insecurity: No Food Insecurity (07/01/2023)   Hunger Vital Sign    Worried About Running Out of Food in the Last Year: Never true    Ran Out of Food in the Last Year: Never true  Transportation Needs: No Transportation Needs (07/01/2023)   PRAPARE -  Administrator, Civil Service (Medical): No    Lack of Transportation (Non-Medical): No  Physical Activity: Inactive (07/01/2023)   Exercise Vital Sign    Days of Exercise per Week: 0 days    Minutes of Exercise per Session: 30 min  Stress: No Stress Concern Present (07/01/2023)   [employer] of Occupational Health - Occupational Stress Questionnaire    Feeling of Stress : Not at all  Social Connections: Socially Isolated (07/01/2023)   Social Connection and Isolation Panel [NHANES]    Frequency of Communication with Friends and Family: Never    Frequency of Social Gatherings with Friends and Family: Never    Attends Religious Services: Never    Database administrator or Organizations: No    Attends Engineer, structural: Not on file    Marital Status: Married  Catering manager Violence: Not on file    Review of Systems  Constitutional:  Negative for fatigue and unexpected weight change.  Eyes:  Negative for visual disturbance.  Respiratory:  Negative for cough, chest tightness and shortness of breath.   Cardiovascular:  Negative for chest pain, palpitations and leg swelling.  Gastrointestinal:  Negative for abdominal pain and blood in stool.  Neurological:  Negative for dizziness, light-headedness and headaches.     Objective:   Vitals:   07/05/23 1346  BP: 122/80  Pulse: 82  Temp: 98.9 F (37.2 C)  TempSrc: Temporal  SpO2: 97%  Weight: 213 lb 3.2 oz (96.7 kg)  Height: 5\' 11"  (1.803 m)     Physical Exam Vitals reviewed.  Constitutional:      Appearance: He is well-developed.  HENT:     Head: Normocephalic and atraumatic.  Neck:     Vascular: No  carotid bruit or JVD.  Cardiovascular:     Rate and Rhythm: Normal rate and regular rhythm.     Heart sounds: Normal heart sounds. No murmur heard. Pulmonary:     Effort: Pulmonary effort is normal.     Breath sounds: Normal breath sounds. No rales.  Musculoskeletal:     Right lower leg: No  edema.     Left lower leg: No edema.  Skin:    General: Skin is warm and dry.  Neurological:     Mental Status: He is alert and oriented to person, place, and time.  Psychiatric:        Mood and Affect: Mood normal.        Assessment & Plan:  Caleb Barajas is a 73 y.o. male . History of hypertension - Plan: Comprehensive metabolic panel  -Check labs, borderline but stable off meds at this time.  Continue to monitor.  Handout given on management of hypertension.  History of rheumatoid arthritis Asymptomatic, continue to monitor.  Consider rheumatology eval for recurrence.  Hyperlipidemia, unspecified hyperlipidemia type - Plan: Comprehensive metabolic panel, Lipid panel Tolerating current regimen, check labs and adjust plan accordingly.  Gastroesophageal reflux disease, unspecified whether esophagitis present Stable with Pepcid, RTC precautions.  Prostate cancer (HCC) Stable on recent note reviewed with urology.  Continue to follow-up with specialist.  Nicotine use - Plan: buPROPion (WELLBUTRIN XL) 150 MG 24 hr tablet Handout given on smoking cessation and steps to quit smoking.  Will be starting Wellbutrin below, may also help with quitting.  He did note he tried to quit cold [place] in the past and unsuccessful.  Depression, recurrent (HCC) - Plan: buPROPion (WELLBUTRIN XL) 150 MG 24 hr tablet Recurrence of symptoms past year.  Has taken sertraline previously, would like to try Wellbutrin this time as possible added benefit with quitting smoking.  Recheck in 1 month.  Potential side effects and risks of meds discussed.  Meds ordered this encounter  Medications   buPROPion (WELLBUTRIN XL) 150 MG 24 hr tablet    Sig: Take 1 tablet (150 mg total) by mouth daily.    Dispense:  30 tablet    Refill:  1   Patient Instructions  No new meds for blood pressure at this time.  See info on quitting smoking and schedule visit to discuss med options when you are ready to quit.  No med  changes at this time. Take care!   Steps to Quit Smoking Smoking tobacco is the leading cause of preventable death. It can affect almost every organ in the body. Smoking puts you and those around you at risk for developing many serious chronic diseases. Quitting smoking can be very challenging. Do not get discouraged if you are not successful the first time. Some people need to make many attempts to quit before they achieve long-term success. Do your best to stick to your quit plan, and talk with your health care provider if you have any questions or concerns. How do I get ready to quit? When you decide to quit smoking, create a plan to help you succeed. Before you quit: Pick a date to quit. Set a date within the next 2 weeks to give you time to prepare. Write down the reasons why you are quitting. Keep this list in places where you will see it often. Tell your family, friends, and co-workers that you are quitting. Support from people you are close to can make quitting easier. Talk with your health care provider  about your options for quitting smoking. Find out what treatment options are covered by your health insurance. Identify people, places, things, and activities that make you want to smoke (triggers). Avoid them. What first steps can I take to quit smoking? Throw away all cigarettes at home, at work, and in your car. Throw away smoking accessories, such as Set designer. Clean your car. Make sure to empty the ashtray. Clean your home, including curtains and carpets. What strategies can I use to quit smoking? Talk with your health care provider about combining strategies, such as taking medicines while you are also receiving in-person counseling. Using these two strategies together makes you more likely to succeed in quitting than if you used either strategy on its own. If you are pregnant or breastfeeding, talk with your health care provider about finding counseling or other  support strategies to quit smoking. Do not take medicine to help you quit smoking unless your health care provider tells you to. Quit right away Quit smoking completely, instead of gradually reducing how much you smoke over a period of time. Stopping smoking right away may be more successful than gradually quitting. Attend in-person counseling to help you build problem-solving skills. You are more likely to succeed in quitting if you attend counseling sessions regularly. Even short sessions of 10 minutes can be effective. Take medicine You may take medicines to help you quit smoking. Some medicines require a prescription. You can also purchase over-the-counter medicines. Medicines may have nicotine in them to replace the nicotine in cigarettes. Medicines may: Help to stop cravings. Help to relieve withdrawal symptoms. Your health care provider may recommend: Nicotine patches, gum, or lozenges. Nicotine inhalers or sprays. Non-nicotine medicine that you take by mouth. Find resources Find resources and support systems that can help you quit smoking and remain smoke-free after you quit. These resources are most helpful when you use them often. They include: Online chats with a Veterinary surgeon. Telephone quitlines. Printed Materials engineer. Support groups or group counseling. Text messaging programs. Mobile phone apps or applications. Use apps that can help you stick to your quit plan by providing reminders, tips, and encouragement. Examples of free services include Quit Guide from the CDC and smokefree.gov  What can I do to make it easier to quit?  Reach out to your family and friends for support and encouragement. Call telephone quitlines, such as 1-800-QUIT-NOW, reach out to support groups, or work with a counselor for support. Ask people who smoke to avoid smoking around you. Avoid places that trigger you to smoke, such as bars, parties, or smoke-break areas at work. Spend time with people who  do not smoke. Lessen the stress in your life. Stress can be a smoking trigger for some people. To lessen stress, try: Exercising regularly. Doing deep-breathing exercises. Doing yoga. Meditating. What benefits will I see if I quit smoking? Over time, you should start to see positive results, such as: Improved sense of smell and taste. Decreased coughing and sore throat. Slower heart rate. Lower blood pressure. Clearer and healthier skin. The ability to breathe more easily. Fewer sick days. Summary Quitting smoking can be very challenging. Do not get discouraged if you are not successful the first time. Some people need to make many attempts to quit before they achieve long-term success. When you decide to quit smoking, create a plan to help you succeed. Quit smoking right away, not slowly over a period of time. Find resources and support systems that can help you quit smoking  and remain smoke-free after you quit. This information is not intended to replace advice given to you by your health care provider. Make sure you discuss any questions you have with your health care provider. Document Revised: 04/16/2021 Document Reviewed: 04/16/2021 Elsevier Patient Education  2024 Elsevier Inc.  Managing Your Hypertension Hypertension, also called high blood pressure, is when the force of the blood pressing against the walls of the arteries is too strong. Arteries are blood vessels that carry blood from your heart throughout your body. Hypertension forces the heart to work harder to pump blood and may cause the arteries to become narrow or stiff. Understanding blood pressure readings A blood pressure reading includes a higher number over a lower number: The first, or top, number is called the systolic pressure. It is a measure of the pressure in your arteries as your heart beats. The second, or bottom number, is called the diastolic pressure. It is a measure of the pressure in your arteries as  the heart relaxes. For most people, a normal blood pressure is below 120/80. Your personal target blood pressure may vary depending on your medical conditions, your age, and other factors. Blood pressure is classified into four stages. Based on your blood pressure reading, your health care provider may use the following stages to determine what type of treatment you need, if any. Systolic pressure and diastolic pressure are measured in a unit called millimeters of mercury (mmHg). Normal Systolic pressure: below 120. Diastolic pressure: below 80. Elevated Systolic pressure: 120-129. Diastolic pressure: below 80. Hypertension stage 1 Systolic pressure: 130-139. Diastolic pressure: 80-89. Hypertension stage 2 Systolic pressure: 140 or above. Diastolic pressure: 90 or above. How can this condition affect me? Managing your hypertension is very important. Over time, hypertension can damage the arteries and decrease blood flow to parts of the body, including the brain, heart, and kidneys. Having untreated or uncontrolled hypertension can lead to: A heart attack. A stroke. A weakened blood vessel (aneurysm). Heart failure. Kidney damage. Eye damage. Memory and concentration problems. Vascular dementia. What actions can I take to manage this condition? Hypertension can be managed by making lifestyle changes and possibly by taking medicines. Your health care provider will help you make a plan to bring your blood pressure within a normal range. You may be referred for counseling on a healthy diet and physical activity. Nutrition  Eat a diet that is high in fiber and potassium, and low in salt (sodium), added sugar, and fat. An example eating plan is called the DASH diet. DASH stands for Dietary Approaches to Stop Hypertension. To eat this way: Eat plenty of fresh fruits and vegetables. Try to fill one-half of your plate at each meal with fruits and vegetables. Eat whole grains, such as  whole-wheat pasta, brown rice, or whole-grain bread. Fill about one-fourth of your plate with whole grains. Eat low-fat dairy products. Avoid fatty cuts of meat, processed or cured meats, and poultry with skin. Fill about one-fourth of your plate with lean proteins such as fish, chicken without skin, beans, eggs, and tofu. Avoid pre-made and processed foods. These tend to be higher in sodium, added sugar, and fat. Reduce your daily sodium intake. Many people with hypertension should eat less than 1,500 mg of sodium a day. Lifestyle  Work with your health care provider to maintain a healthy body weight or to lose weight. Ask what an ideal weight is for you. Get at least 30 minutes of exercise that causes your heart to beat faster (  aerobic exercise) most days of the week. Activities may include walking, swimming, or biking. Include exercise to strengthen your muscles (resistance exercise), such as weight lifting, as part of your weekly exercise routine. Try to do these types of exercises for 30 minutes at least 3 days a week. Do not use any products that contain nicotine or tobacco. These products include cigarettes, chewing tobacco, and vaping devices, such as e-cigarettes. If you need help quitting, ask your health care provider. Control any long-term (chronic) conditions you have, such as high cholesterol or diabetes. Identify your sources of stress and find ways to manage stress. This may include meditation, deep breathing, or making time for fun activities. Alcohol use Do not drink alcohol if: Your health care provider tells you not to drink. You are pregnant, may be pregnant, or are planning to become pregnant. If you drink alcohol: Limit how much you have to: 0-1 drink a day for women. 0-2 drinks a day for men. Know how much alcohol is in your drink. In the [place] one drink equals one 12 oz bottle of beer (355 mL), one 5 oz glass of wine (148 mL), or one 1 oz glass of hard liquor (44  mL). Medicines Your health care provider may prescribe medicine if lifestyle changes are not enough to get your blood pressure under control and if: Your systolic blood pressure is 130 or higher. Your diastolic blood pressure is 80 or higher. Take medicines only as told by your health care provider. Follow the directions carefully. Blood pressure medicines must be taken as told by your health care provider. The medicine does not work as well when you skip doses. Skipping doses also puts you at risk for problems. Monitoring Before you monitor your blood pressure: Do not smoke, drink caffeinated beverages, or exercise within 30 minutes before taking a measurement. Use the bathroom and empty your bladder (urinate). Sit quietly for at least 5 minutes before taking measurements. Monitor your blood pressure at home as told by your health care provider. To do this: Sit with your back straight and supported. Place your feet flat on the floor. Do not cross your legs. Support your arm on a flat surface, such as a table. Make sure your upper arm is at heart level. Each time you measure, take two or three readings one minute apart and record the results. You may also need to have your blood pressure checked regularly by your health care provider. General information Talk with your health care provider about your diet, exercise habits, and other lifestyle factors that may be contributing to hypertension. Review all the medicines you take with your health care provider because there may be side effects or interactions. Keep all follow-up visits. Your health care provider can help you create and adjust your plan for managing your high blood pressure. Where to find more information National Heart, Lung, and Blood Institute: PopSteam.is American Heart Association: www.heart.org Contact a health care provider if: You think you are having a reaction to medicines you have taken. You have repeated  (recurrent) headaches. You feel dizzy. You have swelling in your ankles. You have trouble with your vision. Get help right away if: You develop a severe headache or confusion. You have unusual weakness or numbness, or you feel faint. You have severe pain in your chest or abdomen. You vomit repeatedly. You have trouble breathing. These symptoms may be an emergency. Get help right away. Call 911. Do not wait to see if the symptoms will go  away. Do not drive yourself to the hospital. Summary Hypertension is when the force of blood pumping through your arteries is too strong. If this condition is not controlled, it may put you at risk for serious complications. Your personal target blood pressure may vary depending on your medical conditions, your age, and other factors. For most people, a normal blood pressure is less than 120/80. Hypertension is managed by lifestyle changes, medicines, or both. Lifestyle changes to help manage hypertension include losing weight, eating a healthy, low-sodium diet, exercising more, stopping smoking, and limiting alcohol. This information is not intended to replace advice given to you by your health care provider. Make sure you discuss any questions you have with your health care provider. Document Revised: 01/07/2021 Document Reviewed: 01/07/2021 Elsevier Patient Education  2024 Elsevier Inc.    Signed,   Meredith Staggers, MD [facility], [facility] 07/05/23 2:29 PM

## 2023-07-05 NOTE — Patient Instructions (Addendum)
 No new meds for blood pressure at this time.  See info on quitting smoking Start Wellbutrin as I can be helpful for depression and may help when you are ready to quit smoking.  Recheck in 1 month to discuss the symptoms and the Wellbutrin dose.  If you have significant side effects or concerns with that medication, let me know sooner. No other med changes at this time. Take care!   Steps to Quit Smoking Smoking tobacco is the leading cause of preventable death. It can affect almost every organ in the body. Smoking puts you and those around you at risk for developing many serious chronic diseases. Quitting smoking can be very challenging. Do not get discouraged if you are not successful the first time. Some people need to make many attempts to quit before they achieve long-term success. Do your best to stick to your quit plan, and talk with your health care provider if you have any questions or concerns. How do I get ready to quit? When you decide to quit smoking, create a plan to help you succeed. Before you quit: Pick a date to quit. Set a date within the next 2 weeks to give you time to prepare. Write down the reasons why you are quitting. Keep this list in places where you will see it often. Tell your family, friends, and co-workers that you are quitting. Support from people you are close to can make quitting easier. Talk with your health care provider about your options for quitting smoking. Find out what treatment options are covered by your health insurance. Identify people, places, things, and activities that make you want to smoke (triggers). Avoid them. What first steps can I take to quit smoking? Throw away all cigarettes at home, at work, and in your car. Throw away smoking accessories, such as Set designer. Clean your car. Make sure to empty the ashtray. Clean your home, including curtains and carpets. What strategies can I use to quit smoking? Talk with your health care  provider about combining strategies, such as taking medicines while you are also receiving in-person counseling. Using these two strategies together makes you more likely to succeed in quitting than if you used either strategy on its own. If you are pregnant or breastfeeding, talk with your health care provider about finding counseling or other support strategies to quit smoking. Do not take medicine to help you quit smoking unless your health care provider tells you to. Quit right away Quit smoking completely, instead of gradually reducing how much you smoke over a period of time. Stopping smoking right away may be more successful than gradually quitting. Attend in-person counseling to help you build problem-solving skills. You are more likely to succeed in quitting if you attend counseling sessions regularly. Even short sessions of 10 minutes can be effective. Take medicine You may take medicines to help you quit smoking. Some medicines require a prescription. You can also purchase over-the-counter medicines. Medicines may have nicotine in them to replace the nicotine in cigarettes. Medicines may: Help to stop cravings. Help to relieve withdrawal symptoms. Your health care provider may recommend: Nicotine patches, gum, or lozenges. Nicotine inhalers or sprays. Non-nicotine medicine that you take by mouth. Find resources Find resources and support systems that can help you quit smoking and remain smoke-free after you quit. These resources are most helpful when you use them often. They include: Online chats with a Veterinary surgeon. Telephone quitlines. Printed Materials engineer. Support groups or group counseling. Text messaging programs.  Mobile phone apps or applications. Use apps that can help you stick to your quit plan by providing reminders, tips, and encouragement. Examples of free services include Quit Guide from the CDC and smokefree.gov  What can I do to make it easier to quit?  Reach out  to your family and friends for support and encouragement. Call telephone quitlines, such as 1-800-QUIT-NOW, reach out to support groups, or work with a counselor for support. Ask people who smoke to avoid smoking around you. Avoid places that trigger you to smoke, such as bars, parties, or smoke-break areas at work. Spend time with people who do not smoke. Lessen the stress in your life. Stress can be a smoking trigger for some people. To lessen stress, try: Exercising regularly. Doing deep-breathing exercises. Doing yoga. Meditating. What benefits will I see if I quit smoking? Over time, you should start to see positive results, such as: Improved sense of smell and taste. Decreased coughing and sore throat. Slower heart rate. Lower blood pressure. Clearer and healthier skin. The ability to breathe more easily. Fewer sick days. Summary Quitting smoking can be very challenging. Do not get discouraged if you are not successful the first time. Some people need to make many attempts to quit before they achieve long-term success. When you decide to quit smoking, create a plan to help you succeed. Quit smoking right away, not slowly over a period of time. Find resources and support systems that can help you quit smoking and remain smoke-free after you quit. This information is not intended to replace advice given to you by your health care provider. Make sure you discuss any questions you have with your health care provider. Document Revised: 04/16/2021 Document Reviewed: 04/16/2021 Elsevier Patient Education  2024 Elsevier Inc.  Managing Your Hypertension Hypertension, also called high blood pressure, is when the force of the blood pressing against the walls of the arteries is too strong. Arteries are blood vessels that carry blood from your heart throughout your body. Hypertension forces the heart to work harder to pump blood and may cause the arteries to become narrow or  stiff. Understanding blood pressure readings A blood pressure reading includes a higher number over a lower number: The first, or top, number is called the systolic pressure. It is a measure of the pressure in your arteries as your heart beats. The second, or bottom number, is called the diastolic pressure. It is a measure of the pressure in your arteries as the heart relaxes. For most people, a normal blood pressure is below 120/80. Your personal target blood pressure may vary depending on your medical conditions, your age, and other factors. Blood pressure is classified into four stages. Based on your blood pressure reading, your health care provider may use the following stages to determine what type of treatment you need, if any. Systolic pressure and diastolic pressure are measured in a unit called millimeters of mercury (mmHg). Normal Systolic pressure: below 120. Diastolic pressure: below 80. Elevated Systolic pressure: 120-129. Diastolic pressure: below 80. Hypertension stage 1 Systolic pressure: 130-139. Diastolic pressure: 80-89. Hypertension stage 2 Systolic pressure: 140 or above. Diastolic pressure: 90 or above. How can this condition affect me? Managing your hypertension is very important. Over time, hypertension can damage the arteries and decrease blood flow to parts of the body, including the brain, heart, and kidneys. Having untreated or uncontrolled hypertension can lead to: A heart attack. A stroke. A weakened blood vessel (aneurysm). Heart failure. Kidney damage. Eye damage. Memory and  concentration problems. Vascular dementia. What actions can I take to manage this condition? Hypertension can be managed by making lifestyle changes and possibly by taking medicines. Your health care provider will help you make a plan to bring your blood pressure within a normal range. You may be referred for counseling on a healthy diet and physical activity. Nutrition  Eat a diet  that is high in fiber and potassium, and low in salt (sodium), added sugar, and fat. An example eating plan is called the DASH diet. DASH stands for Dietary Approaches to Stop Hypertension. To eat this way: Eat plenty of fresh fruits and vegetables. Try to fill one-half of your plate at each meal with fruits and vegetables. Eat whole grains, such as whole-wheat pasta, brown rice, or whole-grain bread. Fill about one-fourth of your plate with whole grains. Eat low-fat dairy products. Avoid fatty cuts of meat, processed or cured meats, and poultry with skin. Fill about one-fourth of your plate with lean proteins such as fish, chicken without skin, beans, eggs, and tofu. Avoid pre-made and processed foods. These tend to be higher in sodium, added sugar, and fat. Reduce your daily sodium intake. Many people with hypertension should eat less than 1,500 mg of sodium a day. Lifestyle  Work with your health care provider to maintain a healthy body weight or to lose weight. Ask what an ideal weight is for you. Get at least 30 minutes of exercise that causes your heart to beat faster (aerobic exercise) most days of the week. Activities may include walking, swimming, or biking. Include exercise to strengthen your muscles (resistance exercise), such as weight lifting, as part of your weekly exercise routine. Try to do these types of exercises for 30 minutes at least 3 days a week. Do not use any products that contain nicotine or tobacco. These products include cigarettes, chewing tobacco, and vaping devices, such as e-cigarettes. If you need help quitting, ask your health care provider. Control any long-term (chronic) conditions you have, such as high cholesterol or diabetes. Identify your sources of stress and find ways to manage stress. This may include meditation, deep breathing, or making time for fun activities. Alcohol use Do not drink alcohol if: Your health care provider tells you not to drink. You are  pregnant, may be pregnant, or are planning to become pregnant. If you drink alcohol: Limit how much you have to: 0-1 drink a day for women. 0-2 drinks a day for men. Know how much alcohol is in your drink. In the [place] one drink equals one 12 oz bottle of beer (355 mL), one 5 oz glass of wine (148 mL), or one 1 oz glass of hard liquor (44 mL). Medicines Your health care provider may prescribe medicine if lifestyle changes are not enough to get your blood pressure under control and if: Your systolic blood pressure is 130 or higher. Your diastolic blood pressure is 80 or higher. Take medicines only as told by your health care provider. Follow the directions carefully. Blood pressure medicines must be taken as told by your health care provider. The medicine does not work as well when you skip doses. Skipping doses also puts you at risk for problems. Monitoring Before you monitor your blood pressure: Do not smoke, drink caffeinated beverages, or exercise within 30 minutes before taking a measurement. Use the bathroom and empty your bladder (urinate). Sit quietly for at least 5 minutes before taking measurements. Monitor your blood pressure at home as told by your health care  provider. To do this: Sit with your back straight and supported. Place your feet flat on the floor. Do not cross your legs. Support your arm on a flat surface, such as a table. Make sure your upper arm is at heart level. Each time you measure, take two or three readings one minute apart and record the results. You may also need to have your blood pressure checked regularly by your health care provider. General information Talk with your health care provider about your diet, exercise habits, and other lifestyle factors that may be contributing to hypertension. Review all the medicines you take with your health care provider because there may be side effects or interactions. Keep all follow-up visits. Your health care  provider can help you create and adjust your plan for managing your high blood pressure. Where to find more information National Heart, Lung, and Blood Institute: PopSteam.is American Heart Association: www.heart.org Contact a health care provider if: You think you are having a reaction to medicines you have taken. You have repeated (recurrent) headaches. You feel dizzy. You have swelling in your ankles. You have trouble with your vision. Get help right away if: You develop a severe headache or confusion. You have unusual weakness or numbness, or you feel faint. You have severe pain in your chest or abdomen. You vomit repeatedly. You have trouble breathing. These symptoms may be an emergency. Get help right away. Call 911. Do not wait to see if the symptoms will go away. Do not drive yourself to the hospital. Summary Hypertension is when the force of blood pumping through your arteries is too strong. If this condition is not controlled, it may put you at risk for serious complications. Your personal target blood pressure may vary depending on your medical conditions, your age, and other factors. For most people, a normal blood pressure is less than 120/80. Hypertension is managed by lifestyle changes, medicines, or both. Lifestyle changes to help manage hypertension include losing weight, eating a healthy, low-sodium diet, exercising more, stopping smoking, and limiting alcohol. This information is not intended to replace advice given to you by your health care provider. Make sure you discuss any questions you have with your health care provider. Document Revised: 01/07/2021 Document Reviewed: 01/07/2021 Elsevier Patient Education  2024 [employer].

## 2023-07-06 LAB — LIPID PANEL
Cholesterol: 132 mg/dL (ref 0–200)
HDL: 42 mg/dL (ref >39.00)
LDL Cholesterol: 24 mg/dL (ref 0–99)
NonHDL: 90.09
Total CHOL/HDL Ratio: 3
Triglycerides: 332 mg/dL — ABNORMAL HIGH (ref 0.0–149.0)
VLDL: 66.4 mg/dL — ABNORMAL HIGH (ref 0.0–40.0)

## 2023-07-06 LAB — COMPREHENSIVE METABOLIC PANEL
ALT: 17 U/L (ref 0–53)
AST: 16 U/L (ref 0–37)
Albumin: 4.3 g/dL (ref 3.5–5.2)
Alkaline Phosphatase: 41 U/L (ref 39–117)
BUN: 16 mg/dL (ref 6–23)
CO2: 30 meq/L (ref 19–32)
Calcium: 9.2 mg/dL (ref 8.4–10.5)
Chloride: 102 meq/L (ref 96–112)
Creatinine, Ser: 1.31 mg/dL (ref 0.40–1.50)
GFR: 55.2 mL/min — ABNORMAL LOW (ref >60.00)
Glucose, Bld: 229 mg/dL — ABNORMAL HIGH (ref 70–99)
Potassium: 4.4 meq/L (ref 3.5–5.1)
Sodium: 138 meq/L (ref 135–145)
Total Bilirubin: 1 mg/dL (ref 0.2–1.2)
Total Protein: 6.5 g/dL (ref 6.0–8.3)

## 2023-07-11 ENCOUNTER — Encounter: Payer: Self-pay | Admitting: Family Medicine

## 2023-07-12 NOTE — Telephone Encounter (Signed)
 Call to discuss lipid lab, N/A LVM  LDL-24 and TG 332 previous TG 179  Current therapy Pravastatin 20 mg daily, Zetia 10 mg daily, Repatha 140 mg Q14D  Need to optimize therapy by adding Vascepa.

## 2023-07-14 ENCOUNTER — Telehealth: Payer: Self-pay | Admitting: Pharmacy Technician

## 2023-07-14 ENCOUNTER — Other Ambulatory Visit (HOSPITAL_COMMUNITY): Payer: Self-pay

## 2023-07-14 ENCOUNTER — Telehealth: Payer: Self-pay | Admitting: Pharmacist

## 2023-07-14 MED ORDER — ICOSAPENT ETHYL 1 G PO CAPS
2.0000 g | ORAL_CAPSULE | Freq: Two times a day (BID) | ORAL | 11 refills | Status: DC
  Filled 2023-07-14 – 2023-10-28 (×2): qty 120, 30d supply, fill #0

## 2023-07-14 NOTE — Telephone Encounter (Signed)
 Pharmacy Patient Advocate Encounter   Received notification from Pt Calls Messages that prior authorization for Vascepa is required/requested.   Insurance verification completed.   The patient is insured through  [employer]  .   Per test claim: The current 07/14/23 day co-pay is, $220.15- one month.  No PA needed at this time. This test claim was processed through [facility]- copay amounts may vary at other pharmacies due to pharmacy/plan contracts, or as the patient moves through the different stages of their insurance plan.

## 2023-07-14 NOTE — Telephone Encounter (Signed)
 Call to discuss recent lipid lab - N/A LVM   LDL-24 and TG 332 previous TG 179  Current therapy Pravastatin 20 mg daily, Zetia 10 mg daily,  Repatha was stopped due to cost    Need to optimize therapy by adding Vascepa.  Will assess coverage for Vascepa.

## 2023-07-14 NOTE — Addendum Note (Signed)
 Addended by: Tylene Fantasia on: 07/14/2023 03:21 PM   Modules accepted: Orders

## 2023-07-17 ENCOUNTER — Other Ambulatory Visit (HOSPITAL_COMMUNITY): Payer: Self-pay

## 2023-07-17 NOTE — Telephone Encounter (Signed)
 Looks like patient already has an active Orthoptist, but never provided income verification so grant has been swept.

## 2023-07-25 ENCOUNTER — Other Ambulatory Visit (HOSPITAL_COMMUNITY): Payer: Self-pay

## 2023-07-26 NOTE — Telephone Encounter (Signed)
 Spoke to wife and ask them to verify income with grant.

## 2023-07-31 ENCOUNTER — Other Ambulatory Visit (HOSPITAL_COMMUNITY): Payer: Self-pay

## 2023-07-31 ENCOUNTER — Telehealth: Payer: Self-pay

## 2023-07-31 ENCOUNTER — Other Ambulatory Visit: Payer: Self-pay

## 2023-07-31 NOTE — Telephone Encounter (Signed)
 Patient HW ID [id number]. HW CONTACT INFO Hours: M-F 9am-5pm EST Phone: [phone]

## 2023-07-31 NOTE — Telephone Encounter (Signed)
 Yes! Reached out to patient via mychart with requested information.

## 2023-08-02 ENCOUNTER — Other Ambulatory Visit (HOSPITAL_COMMUNITY): Payer: Self-pay

## 2023-08-02 ENCOUNTER — Other Ambulatory Visit: Payer: Self-pay | Admitting: Family Medicine

## 2023-08-02 ENCOUNTER — Ambulatory Visit (INDEPENDENT_AMBULATORY_CARE_PROVIDER_SITE_OTHER): Payer: Medicare Other | Admitting: Family Medicine

## 2023-08-02 ENCOUNTER — Encounter: Payer: Self-pay | Admitting: Family Medicine

## 2023-08-02 VITALS — BP 122/80 | HR 75 | Temp 98.1°F | Ht 71.0 in | Wt 210.8 lb

## 2023-08-02 DIAGNOSIS — F339 Major depressive disorder, recurrent, unspecified: Secondary | ICD-10-CM

## 2023-08-02 DIAGNOSIS — Z720 Tobacco use: Secondary | ICD-10-CM

## 2023-08-02 DIAGNOSIS — R739 Hyperglycemia, unspecified: Secondary | ICD-10-CM | POA: Diagnosis not present

## 2023-08-02 MED ORDER — METFORMIN HCL 500 MG PO TABS
500.0000 mg | ORAL_TABLET | Freq: Every day | ORAL | 1 refills | Status: DC
  Filled 2023-08-02: qty 90, 90d supply, fill #0
  Filled 2023-10-28: qty 90, 90d supply, fill #1

## 2023-08-02 NOTE — Progress Notes (Signed)
 Subjective:  Patient ID: Caleb Barajas, male    DOB: [birth year]  Age: 73 y.o. MRN: [id number]  CC:  Chief Complaint  Patient presents with   Depression    Feel good notes much better doesn't think he needs an increase at this time     HPI Caleb Barajas presents for   Depression: Follow-up visit from February 26.  Recurrence of symptoms of depression over the past year.  Previously had been on sertraline, but decided to try Wellbutrin instead nightly for depression symptoms but to help with quitting smoking.  No side effects. Mood is better.  Still working on quitting smoking. Started doing some walking.       08/02/2023    1:29 PM 07/05/2023    1:49 PM 03/30/2023    3:09 PM 04/13/2015    5:35 PM  Depression screen PHQ 2/9  Decreased Interest 0 1 1 0  Down, Depressed, Hopeless 0 2 0 0  PHQ - 2 Score 0 3 1 0  Altered sleeping 0 0 0   Tired, decreased energy 0 1 0   Change in appetite 0 0 0   Feeling bad or failure about yourself  0 0 0   Trouble concentrating 0 0 0   Moving slowly or fidgety/restless 0 0 0   Suicidal thoughts 0 0 0   PHQ-9 Score 0 4 1    Hyperglycemia: Borderline in past. 101-106.  Glucose 229 on 2/26. No change in thirst, blurry vision, or frequent urination.  Prior pt of Dr. Duanne Guess prior to establishing with me. Glucose 120 on 10/31/22.  No n/v/abd pain.   History Patient Active Problem List   Diagnosis Date Noted   History of colonic polyps 07/05/2023   Essential hypertension 06/23/2022   Mixed hyperlipidemia 06/23/2022   Tobacco abuse 06/23/2022   Coronary artery calcification 06/23/2022   Aortic atherosclerosis (HCC) 06/23/2022   Myalgia due to statin 06/23/2022   Stage T1c adenocarcinoma of the prostate with Gleason score of 3+3 and a PSA of 5.53 - favorable risk 11/13/2012   Past Medical History:  Diagnosis Date   Acute embolism and thrombosis of other specified deep vein of unspecified lower extremity (HCC)    Asthma    as a child    Barrett esophagus    Cellulitis    DVT (deep venous thrombosis) (HCC)    Occlusive R DVT.    GERD (gastroesophageal reflux disease)    History of BPH    Hyperlipidemia    Hypogonadism male    Impotence of organic origin    Impotence of organic origin    OSA (obstructive sleep apnea)    states not using at this time   Prostate cancer (HCC)    radioactive seedfs    Rheumatoid aortitis    Rheumatoid arthritis(714.0)    Unspecified asthma(493.90)    Past Surgical History:  Procedure Laterality Date   CYSTOSCOPY WITH URETHRAL DILATATION N/A 03/15/2016   Procedure: CYSTOSCOPY WITH URETHRAL DILATATION OF STRICTURE;  Surgeon: Bjorn Pippin, MD;  Location: [facility];  Service: Urology;  Laterality: N/A;   INGUINAL HERNIA REPAIR Left 1960   PROSTATE BIOPSY     RADIOACTIVE SEED IMPLANT N/A 01/03/2013   Procedure: RADIOACTIVE SEED IMPLANT;  Surgeon: [name], MD;  Location: [facility];  Service: Urology;  Laterality: N/A;   No Known Allergies Prior to Admission medications   Medication Sig Start Date End Date Taking? Authorizing Provider  B Complex Vitamins (VITAMIN  B COMPLEX PO) Take by mouth.    [provider]  buPROPion (WELLBUTRIN XL) 150 MG 24 hr tablet Take 1 tablet (150 mg total) by mouth daily. 07/05/23   Shade Flood, MD  Cholecalciferol (VITAMIN D PO) Take 1 tablet by mouth daily.    [provider]  ezetimibe (ZETIA) 10 MG tablet Take 1 tablet (10 mg total) by mouth daily. 08/25/22   Christell Constant, MD  famotidine (PEPCID) 40 MG tablet Take 40 mg by mouth daily. 06/16/22   [provider]  icosapent Ethyl (VASCEPA) 1 g capsule Take 2 capsules (2 g total) by mouth 2 (two) times daily. 07/14/23   Christell Constant, MD  pravastatin (PRAVACHOL) 20 MG tablet Take 1 tablet (20 mg total) by mouth every evening. 12/05/22   Chandrasekhar, Mahesh A, MD  pyridOXINE (VITAMIN B-6) 100 MG tablet Take 100 mg by mouth daily.    [provider]  tamsulosin (FLOMAX) 0.4 MG CAPS capsule Take 1 capsule (0.4 mg total) by mouth daily. Patient taking differently: Take 0.8 mg by mouth daily. 01/03/13   Bjorn Pippin, MD  Testosterone (ANDROGEL) 20.25 MG/1.25GM (1.62%) GEL Place 2 application  onto the skin daily.    [provider]  Testosterone 20.25 MG/ACT (1.62%) GEL Apply 2.5 g (1 pump to each shoulder) topically in the morning. 05/29/23     valsartan (DIOVAN) 80 MG tablet Take by mouth daily. 05/21/22   [provider]   Social History   Socioeconomic History   Marital status: Married    Spouse name: Not on file   Number of children: Not on file   Years of education: Not on file   Highest education level: Associate degree: occupational, Scientist, product/process development, or vocational program  Occupational History   Not on file  Tobacco Use   Smoking status: Every Day    Current packs/day: 0.00    Average packs/day: 1 pack/day for 35.0 years (35.0 ttl pk-yrs)    Types: Cigarettes    Start date: 05/09/1966    Last attempt to quit: 05/09/2001    Years since quitting: 22.2   Smokeless tobacco: Never   Tobacco comments:    Smokes a fair amt in the car and travels alot.  Substance and Sexual Activity   Alcohol use: Yes    Comment: 2-3 per day   Drug use: No   Sexual activity: Not Currently  Other Topics Concern   Not on file  Social History Narrative   Not on file   Social Drivers of Health   Financial Resource Strain: Low Risk  (07/01/2023)   Overall Financial Resource Strain (CARDIA)    Difficulty of Paying Living Expenses: Not hard at all  Food Insecurity: No Food Insecurity (07/01/2023)   Hunger Vital Sign    Worried About Running Out of Food in the Last Year: Never true    Ran Out of Food in the Last Year: Never true  Transportation Needs: No Transportation Needs (07/01/2023)   PRAPARE - Administrator, Civil Service (Medical): No    Lack of Transportation (Non-Medical): No  Physical Activity:  Inactive (07/01/2023)   Exercise Vital Sign    Days of Exercise per Week: 0 days    Minutes of Exercise per Session: 30 min  Stress: No Stress Concern Present (07/01/2023)   [employer] of Occupational Health - Occupational Stress Questionnaire    Feeling of Stress : Not at all  Social Connections: Socially Isolated (07/01/2023)  Social Advertising account executive [NHANES]    Frequency of Communication with Friends and Family: Never    Frequency of Social Gatherings with Friends and Family: Never    Attends Religious Services: Never    Database administrator or Organizations: No    Attends Engineer, structural: Not on file    Marital Status: Married  Catering manager Violence: Not on file    Review of Systems  Per HPI Objective:   Vitals:   08/02/23 1331  BP: 122/80  Pulse: 75  Temp: 98.1 F (36.7 C)  TempSrc: Temporal  SpO2: 96%  Weight: 210 lb 12.8 oz (95.6 kg)  Height: 5\' 11"  (1.803 m)     Physical Exam Vitals reviewed.  Constitutional:      Appearance: He is well-developed.  HENT:     Head: Normocephalic and atraumatic.  Neck:     Vascular: No carotid bruit or JVD.  Cardiovascular:     Rate and Rhythm: Normal rate and regular rhythm.     Heart sounds: Normal heart sounds. No murmur heard. Pulmonary:     Effort: Pulmonary effort is normal.     Breath sounds: Normal breath sounds. No rales.  Musculoskeletal:     Right lower leg: No edema.     Left lower leg: No edema.  Skin:    General: Skin is warm and dry.  Neurological:     Mental Status: He is alert and oriented to person, place, and time.  Psychiatric:        Mood and Affect: Mood normal.    Assessment & Plan:  Caleb Barajas is a 73 y.o. male . Depression, recurrent (HCC)  -Improved with Wellbutrin, continue same.  Nicotine use  -Continue to work on cessation, hopefully Wellbutrin will help with some of the cravings.  Advised to let me know if we can help with resources for  cessation.  Hyperglycemia - Plan: metFORMIN (GLUCOPHAGE) 500 MG tablet, Basic metabolic panel, Hemoglobin A1c  -Progressing and noted on recent labs, suspect prior prediabetes, now at diabetic level.  Likely will need to start metformin but will check A1c, repeat labs today.  Potential side effects of metformin discussed, 1 month follow-up.    Meds ordered this encounter  Medications   metFORMIN (GLUCOPHAGE) 500 MG tablet    Sig: Take 1 tablet (500 mg total) by mouth daily with breakfast.    Dispense:  90 tablet    Refill:  1   Patient Instructions  Thanks for coming today.  Glad to hear that Wellbutrin has been tolerated well and improving symptoms.  Hopefully that will help with quitting smoking as well, let me know if we can help.  Unfortunately blood sugar was much higher on recent testing and it has been in the past.  At that level I am suspicious for diabetes.  I will check labs again including a 15-month blood sugar test.  If that number is elevated, my initial approach would typically be metformin once per day and I have sent that prescription to your pharmacy.  Recheck with me in 1 month depending on labs.  Let me know if there are questions sooner.  Take care!     Signed,   Meredith Staggers, MD [facility], [facility] 08/02/23 2:05 PM

## 2023-08-02 NOTE — Patient Instructions (Addendum)
 Thanks for coming today.  Glad to hear that Wellbutrin has been tolerated well and improving symptoms.  Hopefully that will help with quitting smoking as well, let me know if we can help.  Unfortunately blood sugar was much higher on recent testing and it has been in the past.  At that level I am suspicious for diabetes.  I will check labs again including a 23-month blood sugar test.  If that number is elevated, my initial approach would typically be metformin once per day and I have sent that prescription to your pharmacy.  Recheck with me in 1 month depending on labs.  Let me know if there are questions sooner.  Take care!

## 2023-08-03 ENCOUNTER — Other Ambulatory Visit (HOSPITAL_COMMUNITY): Payer: Self-pay

## 2023-08-03 LAB — BASIC METABOLIC PANEL WITH GFR
BUN: 17 mg/dL (ref 6–23)
CO2: 28 meq/L (ref 19–32)
Calcium: 9.5 mg/dL (ref 8.4–10.5)
Chloride: 102 meq/L (ref 96–112)
Creatinine, Ser: 1.4 mg/dL (ref 0.40–1.50)
GFR: 50.94 mL/min — ABNORMAL LOW (ref >60.00)
Glucose, Bld: 146 mg/dL — ABNORMAL HIGH (ref 70–99)
Potassium: 4.2 meq/L (ref 3.5–5.1)
Sodium: 138 meq/L (ref 135–145)

## 2023-08-03 MED ORDER — FAMOTIDINE 40 MG PO TABS
40.0000 mg | ORAL_TABLET | Freq: Every day | ORAL | 1 refills | Status: DC
Start: 2023-08-03 — End: 2024-02-06
  Filled 2023-08-03: qty 90, 90d supply, fill #0
  Filled 2023-10-10 – 2023-11-07 (×3): qty 90, 90d supply, fill #1

## 2023-08-05 LAB — HEMOGLOBIN A1C: Hgb A1c MFr Bld: 6.5 % (ref 4.6–6.5)

## 2023-08-06 ENCOUNTER — Encounter: Payer: Self-pay | Admitting: Family Medicine

## 2023-08-31 ENCOUNTER — Other Ambulatory Visit: Payer: Self-pay | Admitting: Family Medicine

## 2023-08-31 ENCOUNTER — Other Ambulatory Visit (HOSPITAL_COMMUNITY): Payer: Self-pay

## 2023-08-31 ENCOUNTER — Other Ambulatory Visit: Payer: Self-pay

## 2023-08-31 DIAGNOSIS — F339 Major depressive disorder, recurrent, unspecified: Secondary | ICD-10-CM

## 2023-08-31 DIAGNOSIS — Z720 Tobacco use: Secondary | ICD-10-CM

## 2023-08-31 MED ORDER — BUPROPION HCL ER (XL) 150 MG PO TB24
150.0000 mg | ORAL_TABLET | Freq: Every day | ORAL | 1 refills | Status: DC
  Filled 2023-08-31: qty 30, 30d supply, fill #0

## 2023-09-04 ENCOUNTER — Ambulatory Visit (INDEPENDENT_AMBULATORY_CARE_PROVIDER_SITE_OTHER): Admitting: Family Medicine

## 2023-09-04 ENCOUNTER — Other Ambulatory Visit (HOSPITAL_COMMUNITY): Payer: Self-pay

## 2023-09-04 VITALS — BP 122/60 | HR 57 | Temp 98.0°F | Ht 71.0 in | Wt 207.0 lb

## 2023-09-04 DIAGNOSIS — R7989 Other specified abnormal findings of blood chemistry: Secondary | ICD-10-CM

## 2023-09-04 DIAGNOSIS — F339 Major depressive disorder, recurrent, unspecified: Secondary | ICD-10-CM | POA: Diagnosis not present

## 2023-09-04 DIAGNOSIS — E119 Type 2 diabetes mellitus without complications: Secondary | ICD-10-CM

## 2023-09-04 DIAGNOSIS — Z720 Tobacco use: Secondary | ICD-10-CM | POA: Diagnosis not present

## 2023-09-04 DIAGNOSIS — Z7984 Long term (current) use of oral hypoglycemic drugs: Secondary | ICD-10-CM

## 2023-09-04 MED ORDER — BLOOD GLUCOSE TEST VI STRP
ORAL_STRIP | 0 refills | Status: AC
  Filled 2023-09-04: qty 50, 50d supply, fill #0
  Filled 2024-03-31: qty 50, 50d supply, fill #1

## 2023-09-04 MED ORDER — LANCET DEVICE MISC
0 refills | Status: AC
  Filled 2023-09-04: qty 1, fill #0

## 2023-09-04 MED ORDER — BUPROPION HCL ER (XL) 150 MG PO TB24
150.0000 mg | ORAL_TABLET | Freq: Every day | ORAL | 1 refills | Status: DC
  Filled 2023-09-04 – 2023-10-10 (×2): qty 90, 90d supply, fill #0

## 2023-09-04 MED ORDER — BLOOD GLUCOSE MONITOR SYSTEM W/DEVICE KIT
PACK | 0 refills | Status: AC
  Filled 2023-09-04: qty 1, 30d supply, fill #0

## 2023-09-04 MED ORDER — ONETOUCH DELICA LANCETS 33G MISC
0 refills | Status: AC
  Filled 2023-09-04: qty 100, 100d supply, fill #0

## 2023-09-04 NOTE — Patient Instructions (Addendum)
 No change in meds today.  If any concerns on labs I will let you know.  See info on blood sugar below. Can check levels 1-2 times a week to monitor progress.   We have the option to increase the Wellbutrin  to 300mg  per day - that can be with 2 of your current prescription. Let me know if you make that change and if it is tolerated and I can send in more medication.   Take care!     Type 2 Diabetes Mellitus, Self-Care, Adult Caring for yourself after you have been diagnosed with type 2 diabetes (type 2 diabetes mellitus) means keeping your blood sugar (glucose) under control with a balance of: Nutrition. Exercise. Lifestyle changes. Medicines or insulin, if needed. Support from your team of health care providers and others. What are the risks? Having type 2 diabetes can put you at risk for other long-term (chronic) conditions, such as heart disease and kidney disease. Your health care provider may prescribe medicines to help prevent complications from diabetes. How to monitor your blood glucose  Check your blood glucose every day or as often as told by your health care provider. Have your A1C (hemoglobin A1C) level checked two or more times a year, or as often as told by your health care provider. Your health care provider will set personalized treatment goals for you. Generally, the goal of treatment is to maintain the following blood glucose levels: Before meals: 80-130 mg/dL (4.4-7.2 mmol/L). After meals: below 180 mg/dL (10 mmol/L). A1C level: less than 7%. How to manage hyperglycemia and hypoglycemia Hyperglycemia symptoms Hyperglycemia, also called high blood glucose, occurs when blood glucose is too high. Make sure you know the early signs of hyperglycemia, such as: Increased thirst. Hunger. Feeling very tired. Needing to urinate more often than usual. Blurry vision. Hypoglycemia symptoms Hypoglycemia, also called low blood glucose, occurs with a blood glucose level at or  below 70 mg/dL (3.9 mmol/L). Diabetes medicines lower your blood glucose and can cause hypoglycemia. The risk for hypoglycemia increases during or after exercise, during sleep, during illness, and when skipping meals or not eating for a long time (fasting). It is important to know the symptoms of hypoglycemia and treat it right away. Always have a 15-gram rapid-acting carbohydrate snack with you to treat low blood glucose. Family members and close friends should also know the symptoms and understand how to treat hypoglycemia, in case you are not able to treat yourself. Symptoms may include: Hunger. Anxiety. Sweating and feeling clammy. Dizziness or feeling light-headed. Sleepiness. Increased heart rate. Irritability. Tingling or numbness around the mouth, lips, or tongue. Restless sleep. Severe hypoglycemia is when your blood glucose level is at or below 54 mg/dL (3 mmol/L). Severe hypoglycemia is an emergency. Do not wait to see if the symptoms will go away. Get medical help right away. Call your local emergency services (911 in the [place] Do not drive yourself to the hospital. If you have severe hypoglycemia and you cannot eat or drink, you may need glucagon. A family member or close friend should learn how to check your blood glucose and how to give you glucagon. Ask your health care provider if you need to have an emergency glucagon kit available. Follow these instructions at home: Medicines Take prescribed insulin or diabetes medicines as told by your health care provider. Do not run out of insulin or other diabetes medicines. Plan ahead so you always have these available. If you use insulin, adjust your dosage based on your  physical activity and what foods you eat. Your health care provider will tell you how to adjust your dosage. Take over-the-counter and prescription medicines only as told by your health care provider. Eating and drinking  What you eat and drink affects your blood  glucose and your insulin dosage. Making good choices helps to control your diabetes and prevent other health problems. A healthy meal plan includes eating lean proteins, complex carbohydrates, fresh fruits and vegetables, low-fat dairy products, and healthy fats. Make an appointment to see a registered dietitian to help you create an eating plan that is right for you. Make sure that you: Follow instructions from your health care provider about eating or drinking restrictions. Drink enough fluid to keep your urine pale yellow. Keep a record of the carbohydrates that you eat. Do this by reading food labels and learning the standard serving sizes of foods. Follow your sick-day plan whenever you cannot eat or drink as usual. Make this plan in advance with your health care provider.  Activity Stay active. Exercise regularly, as told by your health care provider. This may include: Stretching and doing strength exercises, such as yoga or weight lifting, two or more times a week. Doing 150 minutes or more of moderate-intensity or vigorous-intensity exercise each week. This could be brisk walking, biking, or water aerobics. Spread out your activity over 3 or more days of the week. Do not go more than 2 days in a row without doing some kind of physical activity. When you start a new exercise or activity, work with your health care provider to adjust your insulin, medicines, or food intake as needed. Lifestyle Do not use any products that contain nicotine or tobacco. These products include cigarettes, chewing tobacco, and vaping devices, such as e-cigarettes. If you need help quitting, ask your health care provider. If you drink alcohol and your health care provider says that it is safe for you: Limit how much you have to: 0-1 drink a day for women who are not pregnant. 0-2 drinks a day for men. Know how much alcohol is in your drink. In the [place] one drink equals one 12 oz bottle of beer (355 mL), one 5 oz  glass of wine (148 mL), or one 1 oz glass of hard liquor (44 mL). Learn to manage stress. If you need help with this, ask your health care provider. Take care of your body  Keep your immunizations up to date. In addition to getting vaccinations as told by your health care provider, it is recommended that you get vaccinated against the following illnesses: The flu (influenza). Get a flu shot every year. Pneumonia. Hepatitis B. Schedule an eye exam soon after your diagnosis, and then one time every year after that. Check your skin and feet every day for cuts, bruises, redness, blisters, or sores. Schedule a foot exam with your health care provider once every year. Brush your teeth and gums two times a day, and floss one or more times a day. Visit your dentist one or more times every 6 months. Maintain a healthy weight. General instructions Share your diabetes management plan with people in your workplace, school, and household. Carry a medical alert card or wear medical alert jewelry. Keep all follow-up visits. This is important. Questions to ask your health care provider Should I meet with a certified diabetes care and education specialist? Where can I find a support group for people with diabetes? Where to find more information For help and guidance and for more  information about diabetes, please visit: American Diabetes Association (ADA): www.diabetes.org American Association of Diabetes Care and Education Specialists (ADCES): www.diabeteseducator.org International Diabetes Federation (IDF): DCOnly.dk Summary Caring for yourself after you have been diagnosed with type 2 diabetes (type 2 diabetes mellitus) means keeping your blood sugar (glucose) under control with a balance of nutrition, exercise, lifestyle changes, and medicine. Check your blood glucose every day, as often as told by your health care provider. Having diabetes can put you at risk for other long-term (chronic)  conditions, such as heart disease and kidney disease. Your health care provider may prescribe medicines to help prevent complications from diabetes. Share your diabetes management plan with people in your workplace, school, and household. Keep all follow-up visits. This is important. This information is not intended to replace advice given to you by your health care provider. Make sure you discuss any questions you have with your health care provider. Document Revised: 09/23/2020 Document Reviewed: 09/23/2020 Elsevier Patient Education  2024 [employer].

## 2023-09-04 NOTE — Progress Notes (Unsigned)
 Subjective:  Patient ID: Caleb Barajas, male    DOB: Dec 30, 1950  Age: 73 y.o. MRN: [id number]  CC:  Chief Complaint  Patient presents with   Medical Management of Chronic Issues    Pt here for 1 month follow up, notes no concerns     HPI Caleb Barajas presents for   Diabetes: Barely at diabetic level March 26.  Metformin  500 mg once per day.  Slight elevated creatinine compared to prior readings, plan for recheck today with adequate fluids discussed.  He is on statin with pravastatin  20 mg daily.  Weight has improved by 3 pounds. No diarrhea. Slight constipation past few weeks.  No home readings.  Staying active. Work around [employer] and yard. More active with better weather.   No nsaids.  Drinking some water during the day. Some coffee in the morning only.  Lab Results  Component Value Date   HGBA1C 6.5 08/02/2023   Wt Readings from Last 3 Encounters:  09/04/23 207 lb (93.9 kg)  08/02/23 210 lb 12.8 oz (95.6 kg)  07/05/23 213 lb 3.2 oz (96.7 kg)    Lab Results  Component Value Date   LDLCALC 24 07/05/2023   CREATININE 1.40 08/02/2023   Lab Results  Component Value Date   WBC 8.0 03/11/2016   HGB 14.5 03/11/2016   HCT 42.1 03/11/2016   MCV 105.3 (H) 03/11/2016   PLT 144 (L) 03/11/2016      Depression: Improving at his March visit on Wellbutrin , along with continuing to work on cessation of nicotine use.  Still doing well.  Down to 1/2 pack per day. Considering 2 per day. No hx of seizures. No side effects.      08/02/2023    1:29 PM 07/05/2023    1:49 PM 03/30/2023    3:09 PM 04/13/2015    5:35 PM  Depression screen PHQ 2/9  Decreased Interest 0 1 1 0  Down, Depressed, Hopeless 0 2 0 0  PHQ - 2 Score 0 3 1 0  Altered sleeping 0 0 0   Tired, decreased energy 0 1 0   Change in appetite 0 0 0   Feeling bad or failure about yourself  0 0 0   Trouble concentrating 0 0 0   Moving slowly or fidgety/restless 0 0 0   Suicidal thoughts 0 0 0   PHQ-9 Score  0 4 1     History Patient Active Problem List   Diagnosis Date Noted   History of colonic polyps 07/05/2023   Essential hypertension 06/23/2022   Mixed hyperlipidemia 06/23/2022   Tobacco abuse 06/23/2022   Coronary artery calcification 06/23/2022   Aortic atherosclerosis (HCC) 06/23/2022   Myalgia due to statin 06/23/2022   Stage T1c adenocarcinoma of the prostate with Gleason score of 3+3 and a PSA of 5.53 - favorable risk 11/13/2012   Past Medical History:  Diagnosis Date   Acute embolism and thrombosis of other specified deep vein of unspecified lower extremity (HCC)    Asthma    as a child   Barrett esophagus    Cellulitis    DVT (deep venous thrombosis) (HCC)    Occlusive R DVT.    GERD (gastroesophageal reflux disease)    History of BPH    Hyperlipidemia    Hypogonadism male    Impotence of organic origin    Impotence of organic origin    OSA (obstructive sleep apnea)    states not using at this time  Prostate cancer (HCC)    radioactive seedfs    Rheumatoid aortitis    Rheumatoid arthritis(714.0)    Unspecified asthma(493.90)    Past Surgical History:  Procedure Laterality Date   CYSTOSCOPY WITH URETHRAL DILATATION N/A 03/15/2016   Procedure: CYSTOSCOPY WITH URETHRAL DILATATION OF STRICTURE;  Surgeon: Homero Luster, MD;  Location: [facility];  Service: Urology;  Laterality: N/A;   INGUINAL HERNIA REPAIR Left 1960   PROSTATE BIOPSY     RADIOACTIVE SEED IMPLANT N/A 01/03/2013   Procedure: RADIOACTIVE SEED IMPLANT;  Surgeon: Willye Harvey, MD;  Location: [facility];  Service: Urology;  Laterality: N/A;   No Known Allergies Prior to Admission medications   Medication Sig Start Date End Date Taking? Authorizing Provider  B Complex Vitamins (VITAMIN B COMPLEX PO) Take by mouth.   Yes [provider]  buPROPion  (WELLBUTRIN  XL) 150 MG 24 hr tablet Take 1 tablet (150 mg total) by mouth daily. 08/31/23  Yes Benjiman Bras, MD  Cholecalciferol  (VITAMIN D PO) Take 1 tablet by mouth daily.   Yes [provider]  ezetimibe  (ZETIA ) 10 MG tablet Take 1 tablet (10 mg total) by mouth daily. 08/25/22  Yes Chandrasekhar, Mahesh A, MD  famotidine  (PEPCID ) 40 MG tablet Take 1 tablet (40 mg total) by mouth daily. 08/03/23  Yes Benjiman Bras, MD  icosapent  Ethyl (VASCEPA ) 1 g capsule Take 2 capsules (2 g total) by mouth 2 (two) times daily. 07/14/23  Yes Chandrasekhar, Mahesh A, MD  metFORMIN  (GLUCOPHAGE ) 500 MG tablet Take 1 tablet (500 mg total) by mouth daily with breakfast. 08/02/23  Yes Benjiman Bras, MD  pravastatin  (PRAVACHOL ) 20 MG tablet Take 1 tablet (20 mg total) by mouth every evening. 12/05/22  Yes Chandrasekhar, Mahesh A, MD  pyridOXINE (VITAMIN B-6) 100 MG tablet Take 100 mg by mouth daily.   Yes [provider]  tamsulosin  (FLOMAX ) 0.4 MG CAPS capsule Take 1 capsule (0.4 mg total) by mouth daily. Patient taking differently: Take 0.8 mg by mouth daily. 01/03/13  Yes Homero Luster, MD  Testosterone  20.25 MG/ACT (1.62%) GEL Apply 2.5 g (1 pump to each shoulder) topically in the morning. 05/29/23  Yes   valsartan (DIOVAN) 80 MG tablet Take by mouth daily. 05/21/22  Yes [provider]   Social History   Socioeconomic History   Marital status: Married    Spouse name: Not on file   Number of children: Not on file   Years of education: Not on file   Highest education level: Associate degree: occupational, Scientist, product/process development, or vocational program  Occupational History   Not on file  Tobacco Use   Smoking status: Every Day    Current packs/day: 0.00    Average packs/day: 1 pack/day for 35.0 years (35.0 ttl pk-yrs)    Types: Cigarettes    Start date: 05/09/1966    Last attempt to quit: 05/09/2001    Years since quitting: 22.3   Smokeless tobacco: Never   Tobacco comments:    Smokes a fair amt in the car and travels alot.  Substance and Sexual Activity   Alcohol use: Yes    Comment: 2-3 per day   Drug use: No    Sexual activity: Not Currently  Other Topics Concern   Not on file  Social History Narrative   Not on file   Social Drivers of Health   Financial Resource Strain: Low Risk  (07/01/2023)   Overall Financial Resource Strain (CARDIA)    Difficulty  of Paying Living Expenses: Not hard at all  Food Insecurity: No Food Insecurity (07/01/2023)   Hunger Vital Sign    Worried About Running Out of Food in the Last Year: Never true    Ran Out of Food in the Last Year: Never true  Transportation Needs: No Transportation Needs (07/01/2023)   PRAPARE - Administrator, Civil Service (Medical): No    Lack of Transportation (Non-Medical): No  Physical Activity: Inactive (07/01/2023)   Exercise Vital Sign    Days of Exercise per Week: 0 days    Minutes of Exercise per Session: 30 min  Stress: No Stress Concern Present (07/01/2023)   [employer] of Occupational Health - Occupational Stress Questionnaire    Feeling of Stress : Not at all  Social Connections: Socially Isolated (07/01/2023)   Social Connection and Isolation Panel [NHANES]    Frequency of Communication with Friends and Family: Never    Frequency of Social Gatherings with Friends and Family: Never    Attends Religious Services: Never    Database administrator or Organizations: No    Attends Engineer, structural: Not on file    Marital Status: Married  Catering manager Violence: Not on file    Review of Systems  Per HPI.  Objective:   Vitals:   09/04/23 1504  BP: 122/60  Pulse: (!) 57  Temp: 98 F (36.7 C)  TempSrc: Temporal  SpO2: 99%  Weight: 207 lb (93.9 kg)  Height: 5\' 11"  (1.803 m)     Physical Exam Vitals reviewed.  Constitutional:      Appearance: He is well-developed.  HENT:     Head: Normocephalic and atraumatic.  Neck:     Vascular: No carotid bruit or JVD.  Cardiovascular:     Rate and Rhythm: Normal rate and regular rhythm.     Heart sounds: Normal heart sounds. No murmur  heard. Pulmonary:     Effort: Pulmonary effort is normal.     Breath sounds: Normal breath sounds. No rales.  Musculoskeletal:     Right lower leg: No edema.     Left lower leg: No edema.  Skin:    General: Skin is warm and dry.  Neurological:     Mental Status: He is alert and oriented to person, place, and time.  Psychiatric:        Mood and Affect: Mood normal.       Assessment & Plan:  Caleb Barajas is a 73 y.o. male . Diabetes mellitus without complication (HCC) - Plan: Blood Glucose Monitoring Suppl DEVI, Glucose Blood (BLOOD GLUCOSE TEST STRIPS) STRP, Lancet Device MISC, Lancets Misc. MISC, Basic metabolic panel with GFR, CBC  - Tolerating metformin .  Continue same, check labs and adjust plan accordingly.  Home blood sugar meter to check intermittently to monitor progress.  Elevated serum creatinine - Plan: Basic metabolic panel with GFR, CBC  - Maintain hydration, avoid NSAIDs, repeat labs then adjust plan accordingly.  Nicotine use - Plan: buPROPion  (WELLBUTRIN  XL) 150 MG 24 hr tablet Depression, recurrent (HCC) - Plan: buPROPion  (WELLBUTRIN  XL) 150 MG 24 hr tablet  - Depression stable, tolerating current dose Wellbutrin  with option of 300 mg dosing if needed for more mood symptoms or if more difficulty with smoking cessation.  If he makes that change he will let me know and I can adjust refills/dose.  Meds ordered this encounter  Medications   Blood Glucose Monitoring Suppl DEVI    Sig: May substitute  to any manufacturer covered by patient's insurance. Check blood sugar up to once daily - fasting or 2 hours after meals.    Dispense:  1 each    Refill:  0   Glucose Blood (BLOOD GLUCOSE TEST STRIPS) STRP    Sig: May substitute to any manufacturer covered by patient's insurance. Check blood sugar up to once daily - fasting or 2 hours after meals.    Dispense:  100 strip    Refill:  0   Lancet Device MISC    Sig: May substitute to any manufacturer covered by patient's  insurance. Check blood sugar up to once daily - fasting or 2 hours after meals.    Dispense:  1 each    Refill:  0   Lancets Misc. MISC    Sig: May substitute to any manufacturer covered by patient's insurance. Check blood sugar up to once daily - fasting or 2 hours after meals.    Dispense:  100 each    Refill:  0   buPROPion  (WELLBUTRIN  XL) 150 MG 24 hr tablet    Sig: Take 1 tablet (150 mg total) by mouth daily.    Dispense:  90 tablet    Refill:  1   Patient Instructions  No change in meds today.  If any concerns on labs I will let you know.  See info on blood sugar below. Can check levels 1-2 times a week to monitor progress.   We have the option to increase the Wellbutrin  to 300mg  per day - that can be with 2 of your current prescription. Let me know if you make that change and if it is tolerated and I can send in more medication.   Take care!     Type 2 Diabetes Mellitus, Self-Care, Adult Caring for yourself after you have been diagnosed with type 2 diabetes (type 2 diabetes mellitus) means keeping your blood sugar (glucose) under control with a balance of: Nutrition. Exercise. Lifestyle changes. Medicines or insulin, if needed. Support from your team of health care providers and others. What are the risks? Having type 2 diabetes can put you at risk for other long-term (chronic) conditions, such as heart disease and kidney disease. Your health care provider may prescribe medicines to help prevent complications from diabetes. How to monitor your blood glucose  Check your blood glucose every day or as often as told by your health care provider. Have your A1C (hemoglobin A1C) level checked two or more times a year, or as often as told by your health care provider. Your health care provider will set personalized treatment goals for you. Generally, the goal of treatment is to maintain the following blood glucose levels: Before meals: 80-130 mg/dL (4.4-7.2 mmol/L). After meals:  below 180 mg/dL (10 mmol/L). A1C level: less than 7%. How to manage hyperglycemia and hypoglycemia Hyperglycemia symptoms Hyperglycemia, also called high blood glucose, occurs when blood glucose is too high. Make sure you know the early signs of hyperglycemia, such as: Increased thirst. Hunger. Feeling very tired. Needing to urinate more often than usual. Blurry vision. Hypoglycemia symptoms Hypoglycemia, also called low blood glucose, occurs with a blood glucose level at or below 70 mg/dL (3.9 mmol/L). Diabetes medicines lower your blood glucose and can cause hypoglycemia. The risk for hypoglycemia increases during or after exercise, during sleep, during illness, and when skipping meals or not eating for a long time (fasting). It is important to know the symptoms of hypoglycemia and treat it right away. Always have a  15-gram rapid-acting carbohydrate snack with you to treat low blood glucose. Family members and close friends should also know the symptoms and understand how to treat hypoglycemia, in case you are not able to treat yourself. Symptoms may include: Hunger. Anxiety. Sweating and feeling clammy. Dizziness or feeling light-headed. Sleepiness. Increased heart rate. Irritability. Tingling or numbness around the mouth, lips, or tongue. Restless sleep. Severe hypoglycemia is when your blood glucose level is at or below 54 mg/dL (3 mmol/L). Severe hypoglycemia is an emergency. Do not wait to see if the symptoms will go away. Get medical help right away. Call your local emergency services (911 in the [place] Do not drive yourself to the hospital. If you have severe hypoglycemia and you cannot eat or drink, you may need glucagon. A family member or close friend should learn how to check your blood glucose and how to give you glucagon. Ask your health care provider if you need to have an emergency glucagon kit available. Follow these instructions at home: Medicines Take prescribed insulin  or diabetes medicines as told by your health care provider. Do not run out of insulin or other diabetes medicines. Plan ahead so you always have these available. If you use insulin, adjust your dosage based on your physical activity and what foods you eat. Your health care provider will tell you how to adjust your dosage. Take over-the-counter and prescription medicines only as told by your health care provider. Eating and drinking  What you eat and drink affects your blood glucose and your insulin dosage. Making good choices helps to control your diabetes and prevent other health problems. A healthy meal plan includes eating lean proteins, complex carbohydrates, fresh fruits and vegetables, low-fat dairy products, and healthy fats. Make an appointment to see a registered dietitian to help you create an eating plan that is right for you. Make sure that you: Follow instructions from your health care provider about eating or drinking restrictions. Drink enough fluid to keep your urine pale yellow. Keep a record of the carbohydrates that you eat. Do this by reading food labels and learning the standard serving sizes of foods. Follow your sick-day plan whenever you cannot eat or drink as usual. Make this plan in advance with your health care provider.  Activity Stay active. Exercise regularly, as told by your health care provider. This may include: Stretching and doing strength exercises, such as yoga or weight lifting, two or more times a week. Doing 150 minutes or more of moderate-intensity or vigorous-intensity exercise each week. This could be brisk walking, biking, or water aerobics. Spread out your activity over 3 or more days of the week. Do not go more than 2 days in a row without doing some kind of physical activity. When you start a new exercise or activity, work with your health care provider to adjust your insulin, medicines, or food intake as needed. Lifestyle Do not use any products  that contain nicotine or tobacco. These products include cigarettes, chewing tobacco, and vaping devices, such as e-cigarettes. If you need help quitting, ask your health care provider. If you drink alcohol and your health care provider says that it is safe for you: Limit how much you have to: 0-1 drink a day for women who are not pregnant. 0-2 drinks a day for men. Know how much alcohol is in your drink. In the [place] one drink equals one 12 oz bottle of beer (355 mL), one 5 oz glass of wine (148 mL), or one 1  oz glass of hard liquor (44 mL). Learn to manage stress. If you need help with this, ask your health care provider. Take care of your body  Keep your immunizations up to date. In addition to getting vaccinations as told by your health care provider, it is recommended that you get vaccinated against the following illnesses: The flu (influenza). Get a flu shot every year. Pneumonia. Hepatitis B. Schedule an eye exam soon after your diagnosis, and then one time every year after that. Check your skin and feet every day for cuts, bruises, redness, blisters, or sores. Schedule a foot exam with your health care provider once every year. Brush your teeth and gums two times a day, and floss one or more times a day. Visit your dentist one or more times every 6 months. Maintain a healthy weight. General instructions Share your diabetes management plan with people in your workplace, school, and household. Carry a medical alert card or wear medical alert jewelry. Keep all follow-up visits. This is important. Questions to ask your health care provider Should I meet with a certified diabetes care and education specialist? Where can I find a support group for people with diabetes? Where to find more information For help and guidance and for more information about diabetes, please visit: American Diabetes Association (ADA): www.diabetes.org American Association of Diabetes Care and Education  Specialists (ADCES): www.diabeteseducator.org International Diabetes Federation (IDF): DCOnly.dk Summary Caring for yourself after you have been diagnosed with type 2 diabetes (type 2 diabetes mellitus) means keeping your blood sugar (glucose) under control with a balance of nutrition, exercise, lifestyle changes, and medicine. Check your blood glucose every day, as often as told by your health care provider. Having diabetes can put you at risk for other long-term (chronic) conditions, such as heart disease and kidney disease. Your health care provider may prescribe medicines to help prevent complications from diabetes. Share your diabetes management plan with people in your workplace, school, and household. Keep all follow-up visits. This is important. This information is not intended to replace advice given to you by your health care provider. Make sure you discuss any questions you have with your health care provider. Document Revised: 09/23/2020 Document Reviewed: 09/23/2020 Elsevier Patient Education  2024 Else[employer] Inc.    Signed,   Caro Christmas, MD [facility], [facility] 09/04/23 3:51 PM

## 2023-09-05 ENCOUNTER — Other Ambulatory Visit (HOSPITAL_COMMUNITY): Payer: Self-pay

## 2023-09-05 ENCOUNTER — Encounter: Payer: Self-pay | Admitting: Family Medicine

## 2023-09-05 ENCOUNTER — Other Ambulatory Visit: Payer: Self-pay

## 2023-09-05 LAB — BASIC METABOLIC PANEL WITH GFR
BUN: 14 mg/dL (ref 6–23)
CO2: 28 meq/L (ref 19–32)
Calcium: 9.4 mg/dL (ref 8.4–10.5)
Chloride: 102 meq/L (ref 96–112)
Creatinine, Ser: 1.18 mg/dL (ref 0.40–1.50)
GFR: 62.5 mL/min (ref >60.00)
Glucose, Bld: 177 mg/dL — ABNORMAL HIGH (ref 70–99)
Potassium: 4.3 meq/L (ref 3.5–5.1)
Sodium: 138 meq/L (ref 135–145)

## 2023-09-05 LAB — CBC
HCT: 44.3 % (ref 39.0–52.0)
Hemoglobin: 15.3 g/dL (ref 13.0–17.0)
MCHC: 34.5 g/dL (ref 30.0–36.0)
MCV: 98.2 fl (ref 78.0–100.0)
Platelets: 150 10*3/uL (ref 150.0–400.0)
RBC: 4.51 Mil/uL (ref 4.22–5.81)
RDW: 13.9 % (ref 11.5–15.5)
WBC: 8.8 10*3/uL (ref 4.0–10.5)

## 2023-09-06 DIAGNOSIS — K080 Exfoliation of teeth due to systemic causes: Secondary | ICD-10-CM | POA: Diagnosis not present

## 2023-09-09 ENCOUNTER — Encounter: Payer: Self-pay | Admitting: Family Medicine

## 2023-09-27 DIAGNOSIS — K080 Exfoliation of teeth due to systemic causes: Secondary | ICD-10-CM | POA: Diagnosis not present

## 2023-10-10 ENCOUNTER — Other Ambulatory Visit: Payer: Self-pay

## 2023-10-10 ENCOUNTER — Other Ambulatory Visit (HOSPITAL_BASED_OUTPATIENT_CLINIC_OR_DEPARTMENT_OTHER): Payer: Self-pay

## 2023-10-10 ENCOUNTER — Other Ambulatory Visit (HOSPITAL_COMMUNITY): Payer: Self-pay

## 2023-10-11 ENCOUNTER — Other Ambulatory Visit: Payer: Self-pay

## 2023-10-11 ENCOUNTER — Other Ambulatory Visit (HOSPITAL_COMMUNITY): Payer: Self-pay

## 2023-10-28 ENCOUNTER — Other Ambulatory Visit (HOSPITAL_COMMUNITY): Payer: Self-pay

## 2023-10-30 ENCOUNTER — Other Ambulatory Visit (HOSPITAL_COMMUNITY): Payer: Self-pay

## 2023-11-14 ENCOUNTER — Other Ambulatory Visit: Payer: Self-pay

## 2023-11-20 DIAGNOSIS — E290 Testicular hyperfunction: Secondary | ICD-10-CM | POA: Diagnosis not present

## 2023-11-27 ENCOUNTER — Other Ambulatory Visit (HOSPITAL_COMMUNITY): Payer: Self-pay

## 2023-11-27 DIAGNOSIS — R3912 Poor urinary stream: Secondary | ICD-10-CM | POA: Diagnosis not present

## 2023-11-27 DIAGNOSIS — N35012 Post-traumatic membranous urethral stricture: Secondary | ICD-10-CM | POA: Diagnosis not present

## 2023-11-27 DIAGNOSIS — Z8546 Personal history of malignant neoplasm of prostate: Secondary | ICD-10-CM | POA: Diagnosis not present

## 2023-11-27 DIAGNOSIS — N401 Enlarged prostate with lower urinary tract symptoms: Secondary | ICD-10-CM | POA: Diagnosis not present

## 2023-11-27 MED ORDER — TESTOSTERONE 20.25 MG/ACT (1.62%) TD GEL
2.5000 g | Freq: Every morning | TRANSDERMAL | 5 refills | Status: DC
  Filled 2023-11-27 – 2023-12-18 (×2): qty 75, 30d supply, fill #0
  Filled 2024-01-24: qty 75, 30d supply, fill #1
  Filled 2024-02-26: qty 75, 30d supply, fill #2
  Filled 2024-03-08 – 2024-03-31 (×2): qty 75, 30d supply, fill #3
  Filled 2024-05-06: qty 75, 30d supply, fill #4

## 2023-12-04 ENCOUNTER — Ambulatory Visit (INDEPENDENT_AMBULATORY_CARE_PROVIDER_SITE_OTHER): Admitting: Family Medicine

## 2023-12-04 ENCOUNTER — Other Ambulatory Visit (HOSPITAL_COMMUNITY): Payer: Self-pay

## 2023-12-04 ENCOUNTER — Encounter: Payer: Self-pay | Admitting: Family Medicine

## 2023-12-04 VITALS — BP 118/60 | HR 83 | Temp 98.0°F | Ht 71.0 in | Wt 204.0 lb

## 2023-12-04 DIAGNOSIS — R739 Hyperglycemia, unspecified: Secondary | ICD-10-CM

## 2023-12-04 DIAGNOSIS — F339 Major depressive disorder, recurrent, unspecified: Secondary | ICD-10-CM

## 2023-12-04 DIAGNOSIS — Z720 Tobacco use: Secondary | ICD-10-CM | POA: Diagnosis not present

## 2023-12-04 DIAGNOSIS — E785 Hyperlipidemia, unspecified: Secondary | ICD-10-CM | POA: Diagnosis not present

## 2023-12-04 DIAGNOSIS — E119 Type 2 diabetes mellitus without complications: Secondary | ICD-10-CM | POA: Diagnosis not present

## 2023-12-04 DIAGNOSIS — Z7984 Long term (current) use of oral hypoglycemic drugs: Secondary | ICD-10-CM

## 2023-12-04 MED ORDER — BUPROPION HCL ER (XL) 150 MG PO TB24
150.0000 mg | ORAL_TABLET | Freq: Every day | ORAL | 1 refills | Status: DC
  Filled 2023-12-04 – 2024-01-18 (×2): qty 90, 90d supply, fill #0
  Filled 2024-04-17: qty 90, 90d supply, fill #1

## 2023-12-04 MED ORDER — METFORMIN HCL 500 MG PO TABS
500.0000 mg | ORAL_TABLET | Freq: Every day | ORAL | 1 refills | Status: DC
  Filled 2023-12-04 – 2024-01-26 (×2): qty 90, 90d supply, fill #0
  Filled 2024-05-04: qty 90, 90d supply, fill #1

## 2023-12-04 NOTE — Progress Notes (Unsigned)
 Subjective:  Patient ID: Caleb Barajas, male    DOB: 1950-10-05  Age: 73 y.o. MRN: [id number]  CC:  Chief Complaint  Patient presents with   Follow-up    Med check and labs    HPI Caleb Barajas presents for   Diabetes: Borderline diabetic level in March, metformin  once per day, 500 mg.  He is on statin with pravastatin  20 mg daily, weight was improving at his March visit.  Down a few more pounds today. Home readings - 98-135 No sx lows. No 200's. Highest 170.  No GI side effects with metformin .   Microalbumin:  today  Optho, foot exam, pneumovax:  Optho in past year.  Shingrix, tdap, covid 19 vaccine - declines  Declines hep C testing.   Lab Results  Component Value Date   HGBA1C 6.5 08/02/2023   Lab Results  Component Value Date   LDLCALC 24 07/05/2023   CREATININE 1.18 09/04/2023   Wt Readings from Last 3 Encounters:  12/04/23 204 lb (92.5 kg)  09/04/23 207 lb (93.9 kg)  08/02/23 210 lb 12.8 oz (95.6 kg)    Hyperlipidemia: Treated by cardiology, Dr. Santo.  Treated with pravastatin , Zetia .   Vascepa  cost prohibitive.  Lab Results  Component Value Date   CHOL 132 07/05/2023   HDL 42.00 07/05/2023   LDLCALC 24 07/05/2023   TRIG 332.0 (H) 07/05/2023   CHOLHDL 3 07/05/2023   Lab Results  Component Value Date   ALT 17 07/05/2023   AST 16 07/05/2023   ALKPHOS 41 07/05/2023   BILITOT 1.0 07/05/2023   BP Readings from Last 3 Encounters:  12/04/23 118/60  09/04/23 122/60  08/02/23 122/80   Lab Results  Component Value Date   CREATININE 1.18 09/04/2023  No current med for BP - off valsartan for awhile - not since seeing me.   Tobacco use/nicotine addiction Improving at his April visit, down to half pack per day.  Wellbutrin  used for depression at that time as well that also helped with nicotine cessation. Down to 1/2 ppd.  Helping some with craving. Mood has been doing well on wellbutrin .      12/04/2023    2:55 PM 08/02/2023    1:29 PM  07/05/2023    1:49 PM 03/30/2023    3:09 PM 04/13/2015    5:35 PM  Depression screen PHQ 2/9  Decreased Interest 0 0 1 1 0  Down, Depressed, Hopeless 0 0 2 0 0  PHQ - 2 Score 0 0 3 1 0  Altered sleeping 0 0 0 0   Tired, decreased energy 0 0 1 0   Change in appetite 0 0 0 0   Feeling bad or failure about yourself  0 0 0 0   Trouble concentrating 0 0 0 0   Moving slowly or fidgety/restless 0 0 0 0   Suicidal thoughts 0 0 0 0   PHQ-9 Score 0 0 4 1   Difficult doing work/chores Not difficult at all        History Patient Active Problem List   Diagnosis Date Noted   History of colonic polyps 07/05/2023   Essential hypertension 06/23/2022   Mixed hyperlipidemia 06/23/2022   Tobacco abuse 06/23/2022   Coronary artery calcification 06/23/2022   Aortic atherosclerosis (HCC) 06/23/2022   Myalgia due to statin 06/23/2022   Stage T1c adenocarcinoma of the prostate with Gleason score of 3+3 and a PSA of 5.53 - favorable risk 11/13/2012   Past Medical History:  Diagnosis Date   Acute embolism and thrombosis of other specified deep vein of unspecified lower extremity (HCC)    Asthma    as a child   Barrett esophagus    Cellulitis    DVT (deep venous thrombosis) (HCC)    Occlusive R DVT.    GERD (gastroesophageal reflux disease)    History of BPH    Hyperlipidemia    Hypogonadism male    Impotence of organic origin    Impotence of organic origin    OSA (obstructive sleep apnea)    states not using at this time   Prostate cancer (HCC)    radioactive seedfs    Rheumatoid aortitis    Rheumatoid arthritis(714.0)    Unspecified asthma(493.90)    Past Surgical History:  Procedure Laterality Date   CYSTOSCOPY WITH URETHRAL DILATATION N/A 03/15/2016   Procedure: CYSTOSCOPY WITH URETHRAL DILATATION OF STRICTURE;  Surgeon: Caleb Barajas;  Location: [facility];  Service: Urology;  Laterality: N/A;   INGUINAL HERNIA REPAIR Left 1960   PROSTATE BIOPSY     RADIOACTIVE SEED IMPLANT N/A  01/03/2013   Procedure: RADIOACTIVE SEED IMPLANT;  Surgeon: Caleb Barajas;  Location: [facility];  Service: Urology;  Laterality: N/A;   No Known Allergies Prior to Admission medications   Medication Sig Start Date End Date Taking? Authorizing Provider  B Complex Vitamins (VITAMIN B COMPLEX PO) Take by mouth.   Yes Provider, Historical, Barajas  Blood Glucose Monitoring Suppl (BLOOD GLUCOSE MONITOR SYSTEM) w/Device KIT Use to check blood sugar up to once daily. Fasting or 2 hours after meals. 09/04/23  Yes Caleb Caleb SAUNDERS, Barajas  buPROPion  (WELLBUTRIN  XL) 150 MG 24 hr tablet Take 1 tablet (150 mg total) by mouth daily. 09/04/23  Yes Caleb Caleb SAUNDERS, Barajas  Cholecalciferol (VITAMIN D PO) Take 1 tablet by mouth daily.   Yes Provider, Historical, Barajas  ezetimibe  (ZETIA ) 10 MG tablet Take 1 tablet (10 mg total) by mouth daily. 08/25/22  Yes Caleb Barajas  famotidine  (PEPCID ) 40 MG tablet Take 1 tablet (40 mg total) by mouth daily. 08/03/23  Yes Caleb Caleb SAUNDERS, Barajas  Glucose Blood (BLOOD GLUCOSE TEST STRIPS) STRP Check blood sugar up to once daily - fasting or 2 hours after meals. 09/04/23  Yes Caleb Caleb SAUNDERS, Barajas  icosapent  Ethyl (VASCEPA ) 1 g capsule Take 2 capsules (2 g total) by mouth 2 (two) times daily. 07/14/23  Yes Caleb Stanly LABOR, Barajas  Lancet Device MISC May substitute to any manufacturer covered by patient's insurance. Check blood sugar up to once daily - fasting or 2 hours after meals. 09/04/23  Yes Caleb Caleb SAUNDERS, Barajas  metFORMIN  (GLUCOPHAGE ) 500 MG tablet Take 1 tablet (500 mg total) by mouth daily with breakfast. 08/02/23  Yes Caleb Caleb SAUNDERS, Barajas  OneTouch Delica Lancets 33G MISC Use to text blood sugar up to once daily. Fasting or 2 hours after meals. 09/04/23  Yes Caleb Caleb SAUNDERS, Barajas  pravastatin  (PRAVACHOL ) 20 MG tablet Take 1 tablet (20 mg total) by mouth every evening. 12/05/22  Yes Caleb Barajas  pyridOXINE (VITAMIN B-6) 100 MG tablet Take 100  mg by mouth daily.   Yes Provider, Historical, Barajas  tamsulosin  (FLOMAX ) 0.4 MG CAPS capsule Take 1 capsule (0.4 mg total) by mouth daily. Patient taking differently: Take 0.8 mg by mouth daily. 01/03/13  Yes Caleb Barajas  Testosterone  20.25 MG/ACT (1.62%) GEL apply 2.5 gm ( 1 pump to each shoulder)  topically qam 11/27/23  Yes   valsartan (DIOVAN) 80 MG tablet Take by mouth daily. 05/21/22  Yes Provider, Historical, Barajas   Social History   Socioeconomic History   Marital status: Married    Spouse name: Not on file   Number of children: Not on file   Years of education: Not on file   Highest education level: Associate degree: occupational, Scientist, product/process development, or vocational program  Occupational History   Not on file  Tobacco Use   Smoking status: Every Day    Current packs/day: 0.00    Average packs/day: 1 pack/day for 35.0 years (35.0 ttl pk-yrs)    Types: Cigarettes    Start date: 05/09/1966    Last attempt to quit: 05/09/2001    Years since quitting: 22.5   Smokeless tobacco: Never   Tobacco comments:    Smokes a fair amt in the car and travels alot.  Substance and Sexual Activity   Alcohol  use: Yes    Comment: 2-3 per day   Drug use: No   Sexual activity: Not Currently  Other Topics Concern   Not on file  Social History Narrative   Not on file   Social Drivers of Health   Financial Resource Strain: Low Risk  (12/01/2023)   Overall Financial Resource Strain (CARDIA)    Difficulty of Paying Living Expenses: Not hard at all  Food Insecurity: No Food Insecurity (12/01/2023)   Hunger Vital Sign    Worried About Running Out of Food in the Last Year: Never true    Ran Out of Food in the Last Year: Never true  Transportation Needs: No Transportation Needs (12/01/2023)   PRAPARE - Administrator, Civil Service (Medical): No    Lack of Transportation (Non-Medical): No  Physical Activity: Insufficiently Active (12/01/2023)   Exercise Vital Sign    Days of Exercise per Week: 1 day     Minutes of Exercise per Session: 60 min  Stress: No Stress Concern Present (12/01/2023)   [employer] of Occupational Health - Occupational Stress Questionnaire    Feeling of Stress: Not at all  Social Connections: Socially Isolated (12/01/2023)   Social Connection and Isolation Panel    Frequency of Communication with Friends and Family: Never    Frequency of Social Gatherings with Friends and Family: Never    Attends Religious Services: Never    Database administrator or Organizations: No    Attends Engineer, structural: Not on file    Marital Status: Married  Catering manager Violence: Not on file    Review of Systems Per HPI  Objective:   Vitals:   12/04/23 1451  BP: 118/60  Pulse: 83  Temp: 98 F (36.7 C)  SpO2: 97%  Weight: 204 lb (92.5 kg)  Height: 5' 11 (1.803 m)     Physical Exam Vitals reviewed.  Constitutional:      Appearance: He is well-developed.  HENT:     Head: Normocephalic and atraumatic.  Neck:     Vascular: No carotid bruit or JVD.  Cardiovascular:     Rate and Rhythm: Normal rate and regular rhythm.     Heart sounds: Normal heart sounds. No murmur heard. Pulmonary:     Effort: Pulmonary effort is normal.     Breath sounds: Normal breath sounds. No rales.  Musculoskeletal:     Right lower leg: No edema.     Left lower leg: No edema.  Skin:    General: Skin is warm  and dry.  Neurological:     Mental Status: He is alert and oriented to person, place, and time.  Psychiatric:        Mood and Affect: Mood normal.     Assessment & Plan:  ANICETO KYSER is a 73 y.o. male . Diabetes mellitus without complication (HCC) - Plan: Comprehensive metabolic panel with GFR, Lipid panel, Hemoglobin A1c, Microalbumin / creatinine urine ratio  - Tolerating current regimen, check labs above and adjust plan accordingly.  Nicotine use - Plan: buPROPion  (WELLBUTRIN  XL) 150 MG 24 hr tablet  - Continue Wellbutrin  and attempts at cessation.   Commended on efforts.  Depression, recurrent (HCC) - Plan: buPROPion  (WELLBUTRIN  XL) 150 MG 24 hr tablet  - Stable on current dose Wellbutrin , continue same.  Hyperglycemia - Plan: metFORMIN  (GLUCOPHAGE ) 500 MG tablet  - Check A1c continue metformin  as above.  Hyperlipidemia, unspecified hyperlipidemia type - Plan: Comprehensive metabolic panel with GFR, Lipid panel  - Tolerating pravastatin , Zetia  as above.  Check labs and adjust plan accordingly.  Meds ordered this encounter  Medications   buPROPion  (WELLBUTRIN  XL) 150 MG 24 hr tablet    Sig: Take 1 tablet (150 mg total) by mouth daily.    Dispense:  90 tablet    Refill:  1   metFORMIN  (GLUCOPHAGE ) 500 MG tablet    Sig: Take 1 tablet (500 mg total) by mouth daily with breakfast.    Dispense:  90 tablet    Refill:  1   Patient Instructions  Thank you for coming in today. No change in medications at this time. If there are any concerns on your bloodwork, I will let you know. Take care!     Signed,   Caleb Pines, Barajas [facility], [facility] 12/04/23 3:38 PM

## 2023-12-04 NOTE — Patient Instructions (Signed)
 Thank you for coming in today. No change in medications at this time. If there are any concerns on your bloodwork, I will let you know. Take care!

## 2023-12-05 LAB — COMPREHENSIVE METABOLIC PANEL WITH GFR
ALT: 18 U/L (ref 0–53)
AST: 15 U/L (ref 0–37)
Albumin: 4.6 g/dL (ref 3.5–5.2)
Alkaline Phosphatase: 37 U/L — ABNORMAL LOW (ref 39–117)
BUN: 17 mg/dL (ref 6–23)
CO2: 26 meq/L (ref 19–32)
Calcium: 9.7 mg/dL (ref 8.4–10.5)
Chloride: 103 meq/L (ref 96–112)
Creatinine, Ser: 1.32 mg/dL (ref 0.40–1.50)
GFR: 54.54 mL/min — ABNORMAL LOW (ref >60.00)
Glucose, Bld: 216 mg/dL — ABNORMAL HIGH (ref 70–99)
Potassium: 3.9 meq/L (ref 3.5–5.1)
Sodium: 139 meq/L (ref 135–145)
Total Bilirubin: 0.9 mg/dL (ref 0.2–1.2)
Total Protein: 6.7 g/dL (ref 6.0–8.3)

## 2023-12-05 LAB — MICROALBUMIN / CREATININE URINE RATIO
Creatinine,U: 227.2 mg/dL
Microalb Creat Ratio: 14.8 mg/g (ref 0.0–30.0)
Microalb, Ur: 3.4 mg/dL — ABNORMAL HIGH (ref 0.0–1.9)

## 2023-12-05 LAB — LIPID PANEL
Cholesterol: 143 mg/dL (ref 0–200)
HDL: 40.5 mg/dL (ref >39.00)
LDL Cholesterol: 46 mg/dL (ref 0–99)
NonHDL: 102.91
Total CHOL/HDL Ratio: 4
Triglycerides: 287 mg/dL — ABNORMAL HIGH (ref 0.0–149.0)
VLDL: 57.4 mg/dL — ABNORMAL HIGH (ref 0.0–40.0)

## 2023-12-05 LAB — HEMOGLOBIN A1C: Hgb A1c MFr Bld: 6.5 % (ref 4.6–6.5)

## 2023-12-07 ENCOUNTER — Ambulatory Visit: Payer: Self-pay | Admitting: Family Medicine

## 2023-12-18 ENCOUNTER — Other Ambulatory Visit (HOSPITAL_COMMUNITY): Payer: Self-pay

## 2023-12-18 ENCOUNTER — Other Ambulatory Visit: Payer: Self-pay

## 2023-12-22 ENCOUNTER — Other Ambulatory Visit (HOSPITAL_COMMUNITY): Payer: Self-pay

## 2024-01-10 ENCOUNTER — Encounter: Payer: Self-pay | Admitting: Family Medicine

## 2024-01-10 MED ORDER — PRAVASTATIN SODIUM 20 MG PO TABS: 20.0000 mg | ORAL_TABLET | Freq: Every evening | ORAL | 3 refills | Status: AC

## 2024-01-10 MED ORDER — EZETIMIBE 10 MG PO TABS: 10.0000 mg | ORAL_TABLET | Freq: Every day | ORAL | 3 refills | Status: AC

## 2024-01-10 NOTE — Telephone Encounter (Signed)
 Are you okay with refilling medications below for patient?

## 2024-01-18 ENCOUNTER — Other Ambulatory Visit: Payer: Self-pay

## 2024-01-18 ENCOUNTER — Other Ambulatory Visit (HOSPITAL_COMMUNITY): Payer: Self-pay

## 2024-01-24 ENCOUNTER — Other Ambulatory Visit (HOSPITAL_COMMUNITY): Payer: Self-pay

## 2024-01-24 ENCOUNTER — Other Ambulatory Visit: Payer: Self-pay

## 2024-01-26 ENCOUNTER — Other Ambulatory Visit: Payer: Self-pay

## 2024-01-26 ENCOUNTER — Other Ambulatory Visit (HOSPITAL_COMMUNITY): Payer: Self-pay

## 2024-01-27 ENCOUNTER — Other Ambulatory Visit (HOSPITAL_COMMUNITY): Payer: Self-pay

## 2024-02-06 ENCOUNTER — Other Ambulatory Visit: Payer: Self-pay | Admitting: Family Medicine

## 2024-02-07 ENCOUNTER — Other Ambulatory Visit (HOSPITAL_COMMUNITY): Payer: Self-pay

## 2024-02-07 MED ORDER — FAMOTIDINE 40 MG PO TABS
40.0000 mg | ORAL_TABLET | Freq: Every day | ORAL | 1 refills | Status: AC
  Filled 2024-02-07: qty 90, 90d supply, fill #0
  Filled 2024-05-04: qty 90, 90d supply, fill #1

## 2024-02-07 NOTE — Progress Notes (Signed)
 NIMESH RIOLO                                          MRN: [id number]   02/07/2024   The [facility] Quality Team Specialist reviewed this patient medical record for the purposes of chart review for care gap closure. The following were reviewed: chart review for care gap closure-diabetic eye exam.    [facility] Quality Team

## 2024-03-05 DIAGNOSIS — K080 Exfoliation of teeth due to systemic causes: Secondary | ICD-10-CM | POA: Diagnosis not present

## 2024-03-08 ENCOUNTER — Other Ambulatory Visit (HOSPITAL_COMMUNITY): Payer: Self-pay

## 2024-04-01 ENCOUNTER — Other Ambulatory Visit (HOSPITAL_COMMUNITY): Payer: Self-pay

## 2024-04-01 ENCOUNTER — Other Ambulatory Visit: Payer: Self-pay

## 2024-05-07 ENCOUNTER — Other Ambulatory Visit (HOSPITAL_COMMUNITY): Payer: Self-pay

## 2024-05-08 ENCOUNTER — Other Ambulatory Visit (HOSPITAL_COMMUNITY): Payer: Self-pay

## 2024-05-08 ENCOUNTER — Other Ambulatory Visit (HOSPITAL_BASED_OUTPATIENT_CLINIC_OR_DEPARTMENT_OTHER): Payer: Self-pay

## 2024-05-15 ENCOUNTER — Other Ambulatory Visit (HOSPITAL_COMMUNITY): Payer: Self-pay

## 2024-05-15 ENCOUNTER — Other Ambulatory Visit: Payer: Self-pay

## 2024-05-16 ENCOUNTER — Other Ambulatory Visit: Payer: Self-pay

## 2024-05-17 ENCOUNTER — Other Ambulatory Visit (HOSPITAL_COMMUNITY): Payer: Self-pay

## 2024-05-22 ENCOUNTER — Inpatient Hospital Stay
Admission: RE | Admit: 2024-05-22 | Discharge: 2024-05-22 | Disposition: A | Source: Ambulatory Visit | Attending: Acute Care | Admitting: Acute Care

## 2024-05-22 DIAGNOSIS — F17210 Nicotine dependence, cigarettes, uncomplicated: Secondary | ICD-10-CM

## 2024-05-22 DIAGNOSIS — Z122 Encounter for screening for malignant neoplasm of respiratory organs: Secondary | ICD-10-CM

## 2024-05-22 DIAGNOSIS — Z87891 Personal history of nicotine dependence: Secondary | ICD-10-CM

## 2024-05-27 ENCOUNTER — Other Ambulatory Visit: Payer: Self-pay

## 2024-05-27 ENCOUNTER — Other Ambulatory Visit (HOSPITAL_COMMUNITY): Payer: Self-pay

## 2024-05-27 MED ORDER — TAMSULOSIN HCL 0.4 MG PO CAPS
0.8000 mg | ORAL_CAPSULE | Freq: Every day | ORAL | 3 refills | Status: AC
  Filled 2024-05-27: qty 90, 45d supply, fill #0

## 2024-05-29 ENCOUNTER — Other Ambulatory Visit (HOSPITAL_COMMUNITY): Payer: Self-pay

## 2024-05-29 MED ORDER — TESTOSTERONE 20.25 MG/ACT (1.62%) TD GEL
1.0000 | Freq: Every morning | TRANSDERMAL | 5 refills | Status: AC
  Filled 2024-05-29: qty 75, 30d supply, fill #0

## 2024-06-03 ENCOUNTER — Other Ambulatory Visit: Payer: Self-pay

## 2024-06-03 DIAGNOSIS — Z122 Encounter for screening for malignant neoplasm of respiratory organs: Secondary | ICD-10-CM

## 2024-06-03 DIAGNOSIS — F17210 Nicotine dependence, cigarettes, uncomplicated: Secondary | ICD-10-CM

## 2024-06-03 DIAGNOSIS — Z87891 Personal history of nicotine dependence: Secondary | ICD-10-CM

## 2024-06-05 ENCOUNTER — Encounter: Payer: Self-pay | Admitting: Family Medicine

## 2024-06-05 ENCOUNTER — Ambulatory Visit: Admitting: Family Medicine

## 2024-06-05 ENCOUNTER — Other Ambulatory Visit (HOSPITAL_COMMUNITY): Payer: Self-pay

## 2024-06-05 ENCOUNTER — Ambulatory Visit

## 2024-06-05 VITALS — BP 114/62 | HR 74 | Temp 98.0°F | Resp 14 | Ht 71.0 in | Wt 204.0 lb

## 2024-06-05 DIAGNOSIS — F339 Major depressive disorder, recurrent, unspecified: Secondary | ICD-10-CM | POA: Diagnosis not present

## 2024-06-05 DIAGNOSIS — Z720 Tobacco use: Secondary | ICD-10-CM | POA: Diagnosis not present

## 2024-06-05 DIAGNOSIS — E119 Type 2 diabetes mellitus without complications: Secondary | ICD-10-CM | POA: Diagnosis not present

## 2024-06-05 DIAGNOSIS — E785 Hyperlipidemia, unspecified: Secondary | ICD-10-CM | POA: Diagnosis not present

## 2024-06-05 DIAGNOSIS — R739 Hyperglycemia, unspecified: Secondary | ICD-10-CM

## 2024-06-05 DIAGNOSIS — Z0000 Encounter for general adult medical examination without abnormal findings: Secondary | ICD-10-CM | POA: Diagnosis not present

## 2024-06-05 DIAGNOSIS — Z7984 Long term (current) use of oral hypoglycemic drugs: Secondary | ICD-10-CM

## 2024-06-05 DIAGNOSIS — Z130 Encounter for screening for diseases of the blood and blood-forming organs and certain disorders involving the immune mechanism: Secondary | ICD-10-CM | POA: Diagnosis not present

## 2024-06-05 MED ORDER — METFORMIN HCL 500 MG PO TABS
500.0000 mg | ORAL_TABLET | Freq: Every day | ORAL | 1 refills | Status: AC
  Filled 2024-06-05: qty 90, 90d supply, fill #0

## 2024-06-05 MED ORDER — BUPROPION HCL ER (XL) 150 MG PO TB24
150.0000 mg | ORAL_TABLET | Freq: Every day | ORAL | 1 refills | Status: AC
  Filled 2024-06-05: qty 90, 90d supply, fill #0

## 2024-06-05 NOTE — Patient Instructions (Signed)
 Thank you for coming in today. No change in medications at this time. If there are any concerns on your bloodwork, I will let you know. Take care!  Preventive Care 23 Years and Older, Male Preventive care refers to lifestyle choices and visits with your health care provider that can promote health and wellness. Preventive care visits are also called wellness exams. What can I expect for my preventive care visit? Counseling During your preventive care visit, your health care provider may ask about your: Medical history, including: Past medical problems. Family medical history. History of falls. Current health, including: Emotional well-being. Home life and relationship well-being. Sexual activity. Memory and ability to understand (cognition). Lifestyle, including: Alcohol, nicotine or tobacco, and drug use. Access to firearms. Diet, exercise, and sleep habits. Work and work Astronomer. Sunscreen use. Safety issues such as seatbelt and bike helmet use. Physical exam Your health care provider will check your: Height and weight. These may be used to calculate your BMI (body mass index). BMI is a measurement that tells if you are at a healthy weight. Waist circumference. This measures the distance around your waistline. This measurement also tells if you are at a healthy weight and may help predict your risk of certain diseases, such as type 2 diabetes and high blood pressure. Heart rate and blood pressure. Body temperature. Skin for abnormal spots. What immunizations do I need?  Vaccines are usually given at various ages, according to a schedule. Your health care provider will recommend vaccines for you based on your age, medical history, and lifestyle or other factors, such as travel or where you work. What tests do I need? Screening Your health care provider may recommend screening tests for certain conditions. This may include: Lipid and cholesterol levels. Diabetes screening.  This is done by checking your blood sugar (glucose) after you have not eaten for a while (fasting). Hepatitis C test. Hepatitis B test. HIV (human immunodeficiency virus) test. STI (sexually transmitted infection) testing, if you are at risk. Lung cancer screening. Colorectal cancer screening. Prostate cancer screening. Abdominal aortic aneurysm (AAA) screening. You may need this if you are a current or former smoker. Talk with your health care provider about your test results, treatment options, and if necessary, the need for more tests. Follow these instructions at home: Eating and drinking  Eat a diet that includes fresh fruits and vegetables, whole grains, lean protein, and low-fat dairy products. Limit your intake of foods with high amounts of sugar, saturated fats, and salt. Take vitamin and mineral supplements as recommended by your health care provider. Do not drink alcohol if your health care provider tells you not to drink. If you drink alcohol: Limit how much you have to 0-2 drinks a day. Know how much alcohol is in your drink. In the [place] one drink equals one 12 oz bottle of beer (355 mL), one 5 oz glass of wine (148 mL), or one 1 oz glass of hard liquor (44 mL). Lifestyle Brush your teeth every morning and night with fluoride toothpaste. Floss one time each day. Exercise for at least 30 minutes 5 or more days each week. Do not use any products that contain nicotine or tobacco. These products include cigarettes, chewing tobacco, and vaping devices, such as e-cigarettes. If you need help quitting, ask your health care provider. Do not use drugs. If you are sexually active, practice safe sex. Use a condom or other form of protection to prevent STIs. Take aspirin only as told by your health  care provider. Make sure that you understand how much to take and what form to take. Work with your health care provider to find out whether it is safe and beneficial for you to take aspirin  daily. Ask your health care provider if you need to take a cholesterol-lowering medicine (statin). Find healthy ways to manage stress, such as: Meditation, yoga, or listening to music. Journaling. Talking to a trusted person. Spending time with friends and family. Safety Always wear your seat belt while driving or riding in a vehicle. Do not drive: If you have been drinking alcohol. Do not ride with someone who has been drinking. When you are tired or distracted. While texting. If you have been using any mind-altering substances or drugs. Wear a helmet and other protective equipment during sports activities. If you have firearms in your house, make sure you follow all gun safety procedures. Minimize exposure to UV radiation to reduce your risk of skin cancer. What's next? Visit your health care provider once a year for an annual wellness visit. Ask your health care provider how often you should have your eyes and teeth checked. Stay up to date on all vaccines. This information is not intended to replace advice given to you by your health care provider. Make sure you discuss any questions you have with your health care provider. Document Revised: 10/21/2020 Document Reviewed: 10/21/2020 Elsevier Patient Education  2024 [employer].

## 2024-06-05 NOTE — Progress Notes (Signed)
 "  Subjective:  Patient ID: Caleb Barajas, male    DOB: 1950-08-19  Age: 74 y.o. MRN: [id number]  CC:  Chief Complaint  Patient presents with   Annual Exam    No questions or concerns.     HPI Caleb Barajas presents for Annual Exam No health changes.  PCP, me Urology, Dr. Watt, history of prostate cancer, BPH with LUTS, posttraumatic membranous urethral stricture.  Appointment in July 2025.  Treated with Flomax  2 per day.  testosterone , prior seed implant. Last week had labs. Reports PSA negligible.  Gastroenterology, Dr. Rollin, history of GERD with Barrett's esophagus, history of colonic polyps. Cardiology, Dr. Santo, history of aortic atherosclerosis, prior statin myopathy but has tolerated pravastatin  40 mg and Zetia .  Prior history of provoked DVT treated with anticoagulation for 6 months without recurrence. Prior rheumatology treatment for rheumatoid arthritis, Dr. Creston, had been treated with methotrexate, sulfasalazine, Plaquenil but unsure of benefit, stopped meds without recent issues. No new arthralgias. Sore in hands with increased use only  Hyperlipidemia: Zetia , pravastatin . No new myalgias/side effects.  Lab Results  Component Value Date   CHOL 143 12/04/2023   HDL 40.50 12/04/2023   LDLCALC 46 12/04/2023   TRIG 287.0 (H) 12/04/2023   CHOLHDL 4 12/04/2023   Lab Results  Component Value Date   ALT 18 12/04/2023   AST 15 12/04/2023   ALKPHOS 37 (L) 12/04/2023   BILITOT 0.9 12/04/2023   Diabetes: Metformin  500mg  every day. No side effects.   Rare home readngs in the green zone, 125, 102, rare 131, 163.  Lab Results  Component Value Date   HGBA1C 6.5 12/04/2023   Wt Readings from Last 3 Encounters:  06/05/24 204 lb (92.5 kg)  12/04/23 204 lb (92.5 kg)  09/04/23 207 lb (93.9 kg)    Depression: Wellbutrin  150mg  XL daily - feels like working well. Stable.  Some mood changes off testosterone , temporary refill given - working with engineer, manufacturing systems.       06/05/2024    2:35 PM 12/04/2023    2:55 PM 08/02/2023    1:29 PM 07/05/2023    1:49 PM 03/30/2023    3:09 PM  Depression screen PHQ 2/9  Decreased Interest 2 0 0 1 1  Down, Depressed, Hopeless 0 0 0 2 0  PHQ - 2 Score 2 0 0 3 1  Altered sleeping 1 0 0 0 0  Tired, decreased energy 1 0 0 1 0  Change in appetite 0 0 0 0 0  Feeling bad or failure about yourself  0 0 0 0 0  Trouble concentrating 1 0 0 0 0  Moving slowly or fidgety/restless 0 0 0 0 0  Suicidal thoughts 0 0 0 0 0  PHQ-9 Score 5 0  0  4  1   Difficult doing work/chores Not difficult at all Not difficult at all        Data saved with a previous flowsheet row definition    Health Maintenance  Topic Date Due   Hepatitis C Screening  Never done   Diabetic kidney evaluation - Urine ACR  06/05/2024   COVID-19 Vaccine (6 - 2025-26 season) 06/21/2024 (Originally 01/08/2024)   Medicare Annual Wellness (AWV)  07/06/2024 (Originally 03/06/2024)   Influenza Vaccine  08/06/2024 (Originally 12/08/2023)   Zoster Vaccines- Shingrix (1 of 2) 09/03/2024 (Originally [birth year]16/1972)   DTaP/Tdap/Td (1 - Tdap) 06/05/2025 (Originally 02/16/1972)   Diabetic kidney evaluation - eGFR measurement  12/03/2024   Lung Cancer Screening  05/22/2025  Colonoscopy  06/21/2032   Pneumococcal Vaccine: 50+ Years  Completed   Meningococcal B Vaccine  Aged Out  Colonoscopy up to date.  Prostate: Followed by urology as above, ongoing monitoring, prostate cancer.  Immunization History  Administered Date(s) Administered   PFIZER(Purple Top)SARS-COV-2 Vaccination 07/01/2019, 07/22/2019, 02/13/2020, 09/02/2020, 02/23/2021   PNEUMOCOCCAL CONJUGATE-20 04/22/2021  Declines flu, COVID, Shingrix and pneumonia vaccines.  No results found. Wears glasses. Few years since appt. Recommended scheduling appt.   Dental: every 3 months, periodontist appt Friday.   Alcohol : 3 per day. Drinking less being at home.   Tobacco: still smoking - cut back to 1/2 ppd.  CT  for lung CA screening on 1/14. Unchanged pulmonary nodules, up to 5.90mm, lung RADS2, repeat in 1 year.   Exercise:walking 3-4 miles per week, until recently. Limited recently - wife has had multiple orthopaedic issues recently - currently in wheelchair. Requires more care. Pans to restart when able.    History Patient Active Problem List   Diagnosis Date Noted   History of colonic polyps 07/05/2023   Essential hypertension 06/23/2022   Mixed hyperlipidemia 06/23/2022   Tobacco abuse 06/23/2022   Coronary artery calcification 06/23/2022   Aortic atherosclerosis 06/23/2022   Myalgia due to statin 06/23/2022   Stage T1c adenocarcinoma of the prostate with Gleason score of 3+3 and a PSA of 5.53 - favorable risk 11/13/2012   Past Medical History:  Diagnosis Date   Acute embolism and thrombosis of other specified deep vein of unspecified lower extremity (HCC)    Asthma    as a child   Barrett esophagus    Cellulitis    DVT (deep venous thrombosis) (HCC)    Occlusive R DVT.    GERD (gastroesophageal reflux disease)    History of BPH    Hyperlipidemia    Hypogonadism male    Impotence of organic origin    Impotence of organic origin    OSA (obstructive sleep apnea)    states not using at this time   Prostate cancer (HCC)    radioactive seedfs    Rheumatoid aortitis    Rheumatoid arthritis(714.0)    Unspecified asthma(493.90)    Past Surgical History:  Procedure Laterality Date   CYSTOSCOPY WITH URETHRAL DILATATION N/A 03/15/2016   Procedure: CYSTOSCOPY WITH URETHRAL DILATATION OF STRICTURE;  Surgeon: Caleb Seltzer, MD;  Location: [facility];  Service: Urology;  Laterality: N/A;   INGUINAL HERNIA REPAIR Left 1960   PROSTATE BIOPSY     RADIOACTIVE SEED IMPLANT N/A 01/03/2013   Procedure: RADIOACTIVE SEED IMPLANT;  Surgeon: Caleb JINNY Seltzer, MD;  Location: [facility];  Service: Urology;  Laterality: N/A;   Allergies[1] Prior to Admission medications  Medication Sig Start  Date End Date Taking? Authorizing Provider  B Complex Vitamins (VITAMIN B COMPLEX PO) Take by mouth.   Yes [provider]  Blood Glucose Monitoring Suppl (BLOOD GLUCOSE MONITOR SYSTEM) w/Device KIT Use to check blood sugar up to once daily. Fasting or 2 hours after meals. 09/04/23  Yes Levora Reyes SAUNDERS, MD  buPROPion  (WELLBUTRIN  XL) 150 MG 24 hr tablet Take 1 tablet (150 mg total) by mouth daily. 12/04/23  Yes Levora Reyes SAUNDERS, MD  Cholecalciferol (VITAMIN D PO) Take 1 tablet by mouth daily.   Yes [provider]  ezetimibe  (ZETIA ) 10 MG tablet Take 1 tablet (10 mg total) by mouth daily. 01/10/24  Yes Levora Reyes SAUNDERS, MD  famotidine  (PEPCID ) 40 MG tablet Take 1 tablet (40 mg total) by  mouth daily. 02/07/24  Yes Levora Reyes SAUNDERS, MD  Glucose Blood (BLOOD GLUCOSE TEST STRIPS) STRP Check blood sugar up to once daily - fasting or 2 hours after meals. 09/04/23  Yes Levora Reyes SAUNDERS, MD  Lancet Device MISC May substitute to any manufacturer covered by patient's insurance. Check blood sugar up to once daily - fasting or 2 hours after meals. 09/04/23  Yes Levora Reyes SAUNDERS, MD  metFORMIN  (GLUCOPHAGE ) 500 MG tablet Take 1 tablet (500 mg total) by mouth daily with breakfast. 12/04/23  Yes Levora Reyes SAUNDERS, MD  OneTouch Delica Lancets 33G MISC Use to text blood sugar up to once daily. Fasting or 2 hours after meals. 09/04/23  Yes Levora Reyes SAUNDERS, MD  pravastatin  (PRAVACHOL ) 20 MG tablet Take 1 tablet (20 mg total) by mouth every evening. 01/10/24  Yes Levora Reyes SAUNDERS, MD  pyridOXINE (VITAMIN B-6) 100 MG tablet Take 100 mg by mouth daily.   Yes [provider]  tamsulosin  (FLOMAX ) 0.4 MG CAPS capsule Take 2 capsules (0.8 mg total) by mouth daily. 05/27/24  Yes   Testosterone  20.25 MG/ACT (1.62%) GEL Apply 1 Pump topically ito the shoulder in the morning. 05/28/24  Yes   tamsulosin  (FLOMAX ) 0.4 MG CAPS capsule Take 1 capsule (0.4 mg total) by mouth daily. Patient not taking: Reported on  06/05/2024 01/03/13   Watt Rush, MD   Social History   Socioeconomic History   Marital status: Married    Spouse name: Not on file   Number of children: Not on file   Years of education: Not on file   Highest education level: Associate degree: occupational, scientist, product/process development, or vocational program  Occupational History   Not on file  Tobacco Use   Smoking status: Every Day    Current packs/day: 0.00    Average packs/day: 1 pack/day for 35.0 years (35.0 ttl pk-yrs)    Types: Cigarettes    Start date: 05/09/1966    Last attempt to quit: 05/09/2001    Years since quitting: 23.0   Smokeless tobacco: Never   Tobacco comments:    Smokes a fair amt in the car and travels alot.  Vaping Use   Vaping status: Never Used  Substance and Sexual Activity   Alcohol  use: Yes    Comment: 2-3 per day   Drug use: No   Sexual activity: Not Currently  Other Topics Concern   Not on file  Social History Narrative   Not on file   Social Drivers of Health   Tobacco Use: High Risk (06/05/2024)   Patient History    Smoking Tobacco Use: Every Day    Smokeless Tobacco Use: Never    Passive Exposure: Not on file  Financial Resource Strain: Low Risk (06/02/2024)   Overall Financial Resource Strain (CARDIA)    Difficulty of Paying Living Expenses: Not hard at all  Food Insecurity: No Food Insecurity (06/02/2024)   Epic    Worried About Radiation Protection Practitioner of Food in the Last Year: Never true    Ran Out of Food in the Last Year: Never true  Transportation Needs: No Transportation Needs (06/02/2024)   Epic    Lack of Transportation (Medical): No    Lack of Transportation (Non-Medical): No  Physical Activity: Unknown (06/02/2024)   Exercise Vital Sign    Days of Exercise per Week: Patient declined    Minutes of Exercise per Session: Not on file  Stress: No Stress Concern Present (06/02/2024)   [employer] of Occupational Health - Occupational  Stress Questionnaire    Feeling of Stress: Not at all  Social  Connections: Socially Isolated (06/02/2024)   Social Connection and Isolation Panel    Frequency of Communication with Friends and Family: Once a week    Frequency of Social Gatherings with Friends and Family: Never    Attends Religious Services: Never    Database Administrator or Organizations: No    Attends Engineer, Structural: Not on file    Marital Status: Married  Catering Manager Violence: Not on file  Depression (PHQ2-9): Medium Risk (06/05/2024)   Depression (PHQ2-9)    PHQ-2 Score: 5  Alcohol  Screen: Low Risk (06/02/2024)   Alcohol  Screen    Last Alcohol  Screening Score (AUDIT): 5  Housing: Unknown (06/02/2024)   Epic    Unable to Pay for Housing in the Last Year: No    Number of Times Moved in the Last Year: Not on file    Homeless in the Last Year: No  Utilities: Not on file  Health Literacy: Not on file    Review of Systems 13 point review of systems per patient health survey noted.  Negative other than as indicated above or in HPI.    Objective:   Vitals:   06/05/24 1433  BP: 114/62  Pulse: 74  Resp: 14  Temp: 98 F (36.7 C)  TempSrc: Temporal  SpO2: 97%  Weight: 204 lb (92.5 kg)  Height: 5' 11 (1.803 m)     Physical Exam Vitals reviewed.  Constitutional:      Appearance: He is well-developed.  HENT:     Head: Normocephalic and atraumatic.     Right Ear: External ear normal.     Left Ear: External ear normal.  Eyes:     Conjunctiva/sclera: Conjunctivae normal.     Pupils: Pupils are equal, round, and reactive to light.  Neck:     Thyroid : No thyromegaly.  Cardiovascular:     Rate and Rhythm: Normal rate and regular rhythm.     Heart sounds: Normal heart sounds.  Pulmonary:     Effort: Pulmonary effort is normal. No respiratory distress.     Breath sounds: Normal breath sounds. No wheezing.  Abdominal:     General: There is no distension.     Palpations: Abdomen is soft.     Tenderness: There is no abdominal tenderness.   Musculoskeletal:        General: No tenderness. Normal range of motion.     Cervical back: Normal range of motion and neck supple.  Lymphadenopathy:     Cervical: No cervical adenopathy.  Skin:    General: Skin is warm and dry.  Neurological:     Mental Status: He is alert and oriented to person, place, and time.     Deep Tendon Reflexes: Reflexes are normal and symmetric.  Psychiatric:        Behavior: Behavior normal.     Assessment & Plan:  Caleb Barajas is a 74 y.o. male . Annual physical exam - Plan: Comprehensive metabolic panel with GFR  - -anticipatory guidance as below in AVS, screening labs above. Health maintenance items as above in HPI discussed/recommended as applicable.   Diabetes mellitus without complication (HCC) - Plan: Comprehensive metabolic panel with GFR, Hemoglobin A1c, Urine Albumin/Creatinine with ratio (send out) [LAB689]  - Tolerating metformin , continue same, check labs and adjust plan accordingly  Depression, recurrent  - Stable on current dose of Wellbutrin .  Off testosterone  which has affected mood previously, recent  short-term refill.  Will continue same dose Wellbutrin  for now after discussion above.  Hyperlipidemia, unspecified hyperlipidemia type - Plan: Comprehensive metabolic panel with GFR, Lipid panel  - Tolerating Zetia , statin combo, continue same and adjust plan accordingly based on labs.  Screening, anemia, deficiency, iron - Plan: CBC with Differential/Platelet   No orders of the defined types were placed in this encounter.  Patient Instructions  Thank you for coming in today. No change in medications at this time. If there are any concerns on your bloodwork, I will let you know. Take care!  Preventive Care 60 Years and Older, Male Preventive care refers to lifestyle choices and visits with your health care provider that can promote health and wellness. Preventive care visits are also called wellness exams. What can I expect for my  preventive care visit? Counseling During your preventive care visit, your health care provider may ask about your: Medical history, including: Past medical problems. Family medical history. History of falls. Current health, including: Emotional well-being. Home life and relationship well-being. Sexual activity. Memory and ability to understand (cognition). Lifestyle, including: Alcohol , nicotine or tobacco, and drug use. Access to firearms. Diet, exercise, and sleep habits. Work and work astronomer. Sunscreen use. Safety issues such as seatbelt and bike helmet use. Physical exam Your health care provider will check your: Height and weight. These may be used to calculate your BMI (body mass index). BMI is a measurement that tells if you are at a healthy weight. Waist circumference. This measures the distance around your waistline. This measurement also tells if you are at a healthy weight and may help predict your risk of certain diseases, such as type 2 diabetes and high blood pressure. Heart rate and blood pressure. Body temperature. Skin for abnormal spots. What immunizations do I need?  Vaccines are usually given at various ages, according to a schedule. Your health care provider will recommend vaccines for you based on your age, medical history, and lifestyle or other factors, such as travel or where you work. What tests do I need? Screening Your health care provider may recommend screening tests for certain conditions. This may include: Lipid and cholesterol levels. Diabetes screening. This is done by checking your blood sugar (glucose) after you have not eaten for a while (fasting). Hepatitis C test. Hepatitis B test. HIV (human immunodeficiency virus) test. STI (sexually transmitted infection) testing, if you are at risk. Lung cancer screening. Colorectal cancer screening. Prostate cancer screening. Abdominal aortic aneurysm (AAA) screening. You may need this if you  are a current or former smoker. Talk with your health care provider about your test results, treatment options, and if necessary, the need for more tests. Follow these instructions at home: Eating and drinking  Eat a diet that includes fresh fruits and vegetables, whole grains, lean protein, and low-fat dairy products. Limit your intake of foods with high amounts of sugar, saturated fats, and salt. Take vitamin and mineral supplements as recommended by your health care provider. Do not drink alcohol  if your health care provider tells you not to drink. If you drink alcohol : Limit how much you have to 0-2 drinks a day. Know how much alcohol  is in your drink. In the [place] one drink equals one 12 oz bottle of beer (355 mL), one 5 oz glass of wine (148 mL), or one 1 oz glass of hard liquor (44 mL). Lifestyle Brush your teeth every morning and night with fluoride toothpaste. Floss one time each day. Exercise for at least 30  minutes 5 or more days each week. Do not use any products that contain nicotine or tobacco. These products include cigarettes, chewing tobacco, and vaping devices, such as e-cigarettes. If you need help quitting, ask your health care provider. Do not use drugs. If you are sexually active, practice safe sex. Use a condom or other form of protection to prevent STIs. Take aspirin only as told by your health care provider. Make sure that you understand how much to take and what form to take. Work with your health care provider to find out whether it is safe and beneficial for you to take aspirin daily. Ask your health care provider if you need to take a cholesterol-lowering medicine (statin). Find healthy ways to manage stress, such as: Meditation, yoga, or listening to music. Journaling. Talking to a trusted person. Spending time with friends and family. Safety Always wear your seat belt while driving or riding in a vehicle. Do not drive: If you have been drinking alcohol . Do  not ride with someone who has been drinking. When you are tired or distracted. While texting. If you have been using any mind-altering substances or drugs. Wear a helmet and other protective equipment during sports activities. If you have firearms in your house, make sure you follow all gun safety procedures. Minimize exposure to UV radiation to reduce your risk of skin cancer. What's next? Visit your health care provider once a year for an annual wellness visit. Ask your health care provider how often you should have your eyes and teeth checked. Stay up to date on all vaccines. This information is not intended to replace advice given to you by your health care provider. Make sure you discuss any questions you have with your health care provider. Document Revised: 10/21/2020 Document Reviewed: 10/21/2020 Elsevier Patient Education  2024 [employer].    Signed,   Reyes Pines, MD [facility], [facility] 06/05/24 3:24 PM      [1] No Known Allergies  "

## 2024-06-06 ENCOUNTER — Other Ambulatory Visit

## 2024-06-06 DIAGNOSIS — E11610 Type 2 diabetes mellitus with diabetic neuropathic arthropathy: Secondary | ICD-10-CM

## 2024-06-06 LAB — CBC WITH DIFFERENTIAL/PLATELET
Basophils Absolute: 0.1 10*3/uL (ref 0.0–0.1)
Basophils Relative: 0.7 % (ref 0.0–3.0)
Eosinophils Absolute: 1.4 10*3/uL — ABNORMAL HIGH (ref 0.0–0.7)
Eosinophils Relative: 18.9 % — ABNORMAL HIGH (ref 0.0–5.0)
HCT: 41.7 % (ref 39.0–52.0)
Hemoglobin: 14.5 g/dL (ref 13.0–17.0)
Lymphocytes Relative: 19.6 % (ref 12.0–46.0)
Lymphs Abs: 1.4 10*3/uL (ref 0.7–4.0)
MCHC: 34.8 g/dL (ref 30.0–36.0)
MCV: 97.9 fl (ref 78.0–100.0)
Monocytes Absolute: 0.5 10*3/uL (ref 0.1–1.0)
Monocytes Relative: 6.6 % (ref 3.0–12.0)
Neutro Abs: 4 10*3/uL (ref 1.4–7.7)
Neutrophils Relative %: 54.2 % (ref 43.0–77.0)
Platelets: 142 10*3/uL — ABNORMAL LOW (ref 150.0–400.0)
RBC: 4.26 Mil/uL (ref 4.22–5.81)
RDW: 12.9 % (ref 11.5–15.5)
WBC: 7.4 10*3/uL (ref 4.0–10.5)

## 2024-06-06 LAB — COMPREHENSIVE METABOLIC PANEL WITH GFR
ALT: 21 U/L (ref 3–53)
AST: 18 U/L (ref 5–37)
Albumin: 4.4 g/dL (ref 3.5–5.2)
Alkaline Phosphatase: 38 U/L — ABNORMAL LOW (ref 39–117)
BUN: 14 mg/dL (ref 6–23)
CO2: 31 meq/L (ref 19–32)
Calcium: 9.4 mg/dL (ref 8.4–10.5)
Chloride: 103 meq/L (ref 96–112)
Creatinine, Ser: 1.19 mg/dL (ref 0.40–1.50)
GFR: 61.54 mL/min
Glucose, Bld: 164 mg/dL — ABNORMAL HIGH (ref 70–99)
Potassium: 4.3 meq/L (ref 3.5–5.1)
Sodium: 140 meq/L (ref 135–145)
Total Bilirubin: 0.8 mg/dL (ref 0.2–1.2)
Total Protein: 6.6 g/dL (ref 6.0–8.3)

## 2024-06-06 LAB — MICROALBUMIN / CREATININE URINE RATIO
Creatinine,U: 119.1 mg/dL
Microalb Creat Ratio: 11 mg/g (ref 0.0–30.0)
Microalb, Ur: 1.3 mg/dL (ref 0.7–1.9)

## 2024-06-06 LAB — LDL CHOLESTEROL, DIRECT: Direct LDL: 59 mg/dL

## 2024-06-06 LAB — LIPID PANEL
Cholesterol: 134 mg/dL (ref 28–200)
HDL: 37.2 mg/dL — ABNORMAL LOW
NonHDL: 97.06
Total CHOL/HDL Ratio: 4
Triglycerides: 480 mg/dL — ABNORMAL HIGH (ref 10.0–149.0)
VLDL: 96 mg/dL — ABNORMAL HIGH (ref 0.0–40.0)

## 2024-06-08 ENCOUNTER — Ambulatory Visit: Payer: Self-pay | Admitting: Family Medicine

## 2024-06-13 ENCOUNTER — Other Ambulatory Visit (HOSPITAL_COMMUNITY): Payer: Self-pay

## 2024-12-04 ENCOUNTER — Ambulatory Visit: Admitting: Family Medicine
